# Patient Record
Sex: Male | Born: 1945 | Race: White | Hispanic: No | Marital: Married | Smoking: Former smoker
Health system: Southern US, Community
[De-identification: ages and names within clinical notes are randomized; demographics above are authoritative.]

## PROBLEM LIST (undated history)

## (undated) DIAGNOSIS — E790 Hyperuricemia without signs of inflammatory arthritis and tophaceous disease: Secondary | ICD-10-CM

## (undated) DIAGNOSIS — E785 Hyperlipidemia, unspecified: Secondary | ICD-10-CM

## (undated) DIAGNOSIS — N289 Disorder of kidney and ureter, unspecified: Secondary | ICD-10-CM

## (undated) DIAGNOSIS — N529 Male erectile dysfunction, unspecified: Secondary | ICD-10-CM

## (undated) DIAGNOSIS — M545 Low back pain, unspecified: Secondary | ICD-10-CM

## (undated) DIAGNOSIS — K222 Esophageal obstruction: Secondary | ICD-10-CM

## (undated) DIAGNOSIS — F419 Anxiety disorder, unspecified: Secondary | ICD-10-CM

## (undated) DIAGNOSIS — Z86010 Personal history of colonic polyps: Secondary | ICD-10-CM

## (undated) DIAGNOSIS — G4733 Obstructive sleep apnea (adult) (pediatric): Secondary | ICD-10-CM

## (undated) DIAGNOSIS — M1990 Unspecified osteoarthritis, unspecified site: Secondary | ICD-10-CM

## (undated) DIAGNOSIS — I2510 Atherosclerotic heart disease of native coronary artery without angina pectoris: Secondary | ICD-10-CM

## (undated) DIAGNOSIS — K2950 Unspecified chronic gastritis without bleeding: Secondary | ICD-10-CM

## (undated) DIAGNOSIS — I10 Essential (primary) hypertension: Secondary | ICD-10-CM

## (undated) DIAGNOSIS — K5730 Diverticulosis of large intestine without perforation or abscess without bleeding: Secondary | ICD-10-CM

## (undated) DIAGNOSIS — G589 Mononeuropathy, unspecified: Secondary | ICD-10-CM

## (undated) DIAGNOSIS — B009 Herpesviral infection, unspecified: Secondary | ICD-10-CM

## (undated) DIAGNOSIS — K219 Gastro-esophageal reflux disease without esophagitis: Secondary | ICD-10-CM

## (undated) DIAGNOSIS — E669 Obesity, unspecified: Secondary | ICD-10-CM

## (undated) HISTORY — DX: Male erectile dysfunction, unspecified: N52.9

## (undated) HISTORY — PX: ANGIOPLASTY: SHX39

## (undated) HISTORY — DX: Hyperuricemia without signs of inflammatory arthritis and tophaceous disease: E79.0

## (undated) HISTORY — DX: Gastro-esophageal reflux disease without esophagitis: K21.9

## (undated) HISTORY — DX: Low back pain, unspecified: M54.50

## (undated) HISTORY — DX: Personal history of colonic polyps: Z86.010

## (undated) HISTORY — PX: SKIN BIOPSY: SHX1

## (undated) HISTORY — DX: Diverticulosis of large intestine without perforation or abscess without bleeding: K57.30

## (undated) HISTORY — PX: CORONARY ARTERY BYPASS GRAFT: SHX141

## (undated) HISTORY — DX: Obesity, unspecified: E66.9

## (undated) HISTORY — DX: Herpesviral infection, unspecified: B00.9

## (undated) HISTORY — DX: Disorder of kidney and ureter, unspecified: N28.9

## (undated) HISTORY — DX: Anxiety disorder, unspecified: F41.9

## (undated) HISTORY — DX: Atherosclerotic heart disease of native coronary artery without angina pectoris: I25.10

## (undated) HISTORY — DX: Unspecified chronic gastritis without bleeding: K29.50

## (undated) HISTORY — DX: Mononeuropathy, unspecified: G58.9

## (undated) HISTORY — DX: Low back pain: M54.5

## (undated) HISTORY — DX: Essential (primary) hypertension: I10

## (undated) HISTORY — DX: Unspecified osteoarthritis, unspecified site: M19.90

## (undated) HISTORY — DX: Obstructive sleep apnea (adult) (pediatric): G47.33

## (undated) HISTORY — DX: Hyperlipidemia, unspecified: E78.5

## (undated) HISTORY — DX: Esophageal obstruction: K22.2

---

## 1999-09-25 ENCOUNTER — Ambulatory Visit: Admission: RE | Admit: 1999-09-25 | Discharge: 1999-09-25 | Payer: Self-pay | Admitting: Pulmonary Disease

## 1999-09-25 ENCOUNTER — Encounter: Payer: Self-pay | Admitting: Internal Medicine

## 2000-01-30 ENCOUNTER — Other Ambulatory Visit: Admission: RE | Admit: 2000-01-30 | Discharge: 2000-01-30 | Payer: Self-pay | Admitting: Urology

## 2000-02-05 ENCOUNTER — Encounter: Payer: Self-pay | Admitting: Internal Medicine

## 2000-02-05 ENCOUNTER — Other Ambulatory Visit: Admission: RE | Admit: 2000-02-05 | Discharge: 2000-02-05 | Payer: Self-pay | Admitting: Urology

## 2000-02-05 ENCOUNTER — Ambulatory Visit (HOSPITAL_BASED_OUTPATIENT_CLINIC_OR_DEPARTMENT_OTHER): Admission: RE | Admit: 2000-02-05 | Discharge: 2000-02-05 | Payer: Self-pay | Admitting: Pulmonary Disease

## 2000-02-05 ENCOUNTER — Encounter (INDEPENDENT_AMBULATORY_CARE_PROVIDER_SITE_OTHER): Payer: Self-pay | Admitting: Specialist

## 2001-03-04 ENCOUNTER — Encounter: Payer: Self-pay | Admitting: *Deleted

## 2001-03-05 ENCOUNTER — Inpatient Hospital Stay (HOSPITAL_COMMUNITY): Admission: RE | Admit: 2001-03-05 | Discharge: 2001-03-09 | Payer: Self-pay | Admitting: *Deleted

## 2001-03-05 ENCOUNTER — Encounter: Payer: Self-pay | Admitting: Thoracic Surgery (Cardiothoracic Vascular Surgery)

## 2001-03-06 ENCOUNTER — Encounter: Payer: Self-pay | Admitting: Thoracic Surgery (Cardiothoracic Vascular Surgery)

## 2001-03-07 ENCOUNTER — Encounter: Payer: Self-pay | Admitting: Thoracic Surgery (Cardiothoracic Vascular Surgery)

## 2001-04-08 ENCOUNTER — Encounter (HOSPITAL_COMMUNITY): Admission: RE | Admit: 2001-04-08 | Discharge: 2001-04-25 | Payer: Self-pay | Admitting: *Deleted

## 2001-11-18 ENCOUNTER — Inpatient Hospital Stay (HOSPITAL_COMMUNITY): Admission: EM | Admit: 2001-11-18 | Discharge: 2001-11-21 | Payer: Self-pay | Admitting: *Deleted

## 2001-11-19 ENCOUNTER — Encounter: Payer: Self-pay | Admitting: Cardiovascular Disease

## 2002-10-02 ENCOUNTER — Emergency Department (HOSPITAL_COMMUNITY): Admission: EM | Admit: 2002-10-02 | Discharge: 2002-10-02 | Payer: Self-pay | Admitting: Emergency Medicine

## 2003-07-11 ENCOUNTER — Emergency Department (HOSPITAL_COMMUNITY): Admission: AD | Admit: 2003-07-11 | Discharge: 2003-07-11 | Payer: Self-pay | Admitting: Emergency Medicine

## 2003-08-27 ENCOUNTER — Emergency Department (HOSPITAL_COMMUNITY): Admission: EM | Admit: 2003-08-27 | Discharge: 2003-08-28 | Payer: Self-pay | Admitting: Emergency Medicine

## 2004-01-04 ENCOUNTER — Encounter (INDEPENDENT_AMBULATORY_CARE_PROVIDER_SITE_OTHER): Payer: Self-pay | Admitting: *Deleted

## 2004-06-18 HISTORY — PX: CHOLECYSTECTOMY: SHX55

## 2004-06-21 ENCOUNTER — Ambulatory Visit: Payer: Self-pay | Admitting: Pulmonary Disease

## 2004-07-20 ENCOUNTER — Ambulatory Visit: Payer: Self-pay | Admitting: Cardiology

## 2004-07-20 ENCOUNTER — Inpatient Hospital Stay (HOSPITAL_COMMUNITY): Admission: EM | Admit: 2004-07-20 | Discharge: 2004-07-23 | Payer: Self-pay | Admitting: Emergency Medicine

## 2004-07-21 ENCOUNTER — Encounter: Payer: Self-pay | Admitting: Cardiology

## 2004-08-08 ENCOUNTER — Ambulatory Visit: Payer: Self-pay | Admitting: Cardiology

## 2004-08-10 ENCOUNTER — Ambulatory Visit (HOSPITAL_COMMUNITY): Admission: RE | Admit: 2004-08-10 | Discharge: 2004-08-10 | Payer: Self-pay | Admitting: Cardiology

## 2004-08-14 ENCOUNTER — Ambulatory Visit (HOSPITAL_COMMUNITY): Admission: RE | Admit: 2004-08-14 | Discharge: 2004-08-14 | Payer: Self-pay | Admitting: Cardiology

## 2004-08-14 ENCOUNTER — Encounter (INDEPENDENT_AMBULATORY_CARE_PROVIDER_SITE_OTHER): Payer: Self-pay | Admitting: *Deleted

## 2004-09-06 ENCOUNTER — Encounter (INDEPENDENT_AMBULATORY_CARE_PROVIDER_SITE_OTHER): Payer: Self-pay | Admitting: *Deleted

## 2004-09-06 ENCOUNTER — Ambulatory Visit: Payer: Self-pay | Admitting: Gastroenterology

## 2004-09-07 ENCOUNTER — Ambulatory Visit: Payer: Self-pay | Admitting: Gastroenterology

## 2004-09-10 ENCOUNTER — Encounter (INDEPENDENT_AMBULATORY_CARE_PROVIDER_SITE_OTHER): Payer: Self-pay | Admitting: *Deleted

## 2004-09-19 ENCOUNTER — Ambulatory Visit: Payer: Self-pay | Admitting: Cardiology

## 2004-09-19 ENCOUNTER — Ambulatory Visit: Payer: Self-pay | Admitting: Pulmonary Disease

## 2004-10-16 ENCOUNTER — Encounter (INDEPENDENT_AMBULATORY_CARE_PROVIDER_SITE_OTHER): Payer: Self-pay | Admitting: *Deleted

## 2004-10-16 ENCOUNTER — Ambulatory Visit: Payer: Self-pay | Admitting: Gastroenterology

## 2004-11-16 ENCOUNTER — Ambulatory Visit: Payer: Self-pay | Admitting: Internal Medicine

## 2004-12-12 ENCOUNTER — Ambulatory Visit: Payer: Self-pay

## 2004-12-15 ENCOUNTER — Ambulatory Visit: Payer: Self-pay | Admitting: Cardiology

## 2005-01-05 ENCOUNTER — Ambulatory Visit: Payer: Self-pay | Admitting: Pulmonary Disease

## 2005-01-17 ENCOUNTER — Ambulatory Visit: Payer: Self-pay | Admitting: Pulmonary Disease

## 2005-02-12 ENCOUNTER — Ambulatory Visit: Payer: Self-pay | Admitting: Pulmonary Disease

## 2005-02-20 ENCOUNTER — Ambulatory Visit: Payer: Self-pay | Admitting: Pulmonary Disease

## 2005-03-22 ENCOUNTER — Ambulatory Visit: Payer: Self-pay | Admitting: Pulmonary Disease

## 2005-04-25 ENCOUNTER — Ambulatory Visit: Payer: Self-pay | Admitting: Pulmonary Disease

## 2005-06-04 ENCOUNTER — Ambulatory Visit: Payer: Self-pay | Admitting: Cardiology

## 2005-06-22 ENCOUNTER — Ambulatory Visit: Payer: Self-pay | Admitting: Pulmonary Disease

## 2005-11-16 ENCOUNTER — Ambulatory Visit: Payer: Self-pay | Admitting: Internal Medicine

## 2005-11-19 ENCOUNTER — Ambulatory Visit: Payer: Self-pay | Admitting: Pulmonary Disease

## 2005-11-27 ENCOUNTER — Ambulatory Visit: Payer: Self-pay | Admitting: Pulmonary Disease

## 2006-02-18 ENCOUNTER — Inpatient Hospital Stay (HOSPITAL_COMMUNITY): Admission: EM | Admit: 2006-02-18 | Discharge: 2006-02-20 | Payer: Self-pay | Admitting: Emergency Medicine

## 2006-02-18 ENCOUNTER — Ambulatory Visit: Payer: Self-pay | Admitting: *Deleted

## 2006-02-22 ENCOUNTER — Ambulatory Visit: Payer: Self-pay

## 2006-02-26 ENCOUNTER — Ambulatory Visit: Payer: Self-pay | Admitting: Gastroenterology

## 2006-03-21 ENCOUNTER — Ambulatory Visit (HOSPITAL_COMMUNITY): Admission: RE | Admit: 2006-03-21 | Discharge: 2006-03-21 | Payer: Self-pay | Admitting: General Surgery

## 2006-03-21 ENCOUNTER — Encounter (INDEPENDENT_AMBULATORY_CARE_PROVIDER_SITE_OTHER): Payer: Self-pay | Admitting: *Deleted

## 2006-03-21 ENCOUNTER — Encounter: Payer: Self-pay | Admitting: Internal Medicine

## 2006-04-03 ENCOUNTER — Ambulatory Visit: Payer: Self-pay | Admitting: Internal Medicine

## 2006-05-08 ENCOUNTER — Ambulatory Visit: Payer: Self-pay | Admitting: Pulmonary Disease

## 2006-06-19 ENCOUNTER — Ambulatory Visit: Payer: Self-pay | Admitting: Pulmonary Disease

## 2006-07-01 ENCOUNTER — Emergency Department (HOSPITAL_COMMUNITY): Admission: EM | Admit: 2006-07-01 | Discharge: 2006-07-02 | Payer: Self-pay | Admitting: Emergency Medicine

## 2006-07-01 ENCOUNTER — Ambulatory Visit: Payer: Self-pay | Admitting: Cardiology

## 2006-07-24 ENCOUNTER — Ambulatory Visit: Payer: Self-pay | Admitting: Internal Medicine

## 2007-01-13 ENCOUNTER — Ambulatory Visit: Payer: Self-pay | Admitting: Pulmonary Disease

## 2007-01-13 LAB — CONVERTED CEMR LAB
ALT: 28 units/L (ref 0–53)
AST: 22 units/L (ref 0–37)
Albumin: 4.1 g/dL (ref 3.5–5.2)
Basophils Absolute: 0 10*3/uL (ref 0.0–0.1)
Calcium: 9.3 mg/dL (ref 8.4–10.5)
Chloride: 103 meq/L (ref 96–112)
Creatinine, Ser: 1.4 mg/dL (ref 0.4–1.5)
Eosinophils Absolute: 0.4 10*3/uL (ref 0.0–0.6)
Eosinophils Relative: 4.5 % (ref 0.0–5.0)
GFR calc non Af Amer: 55 mL/min
Glucose, Bld: 97 mg/dL (ref 70–99)
HCT: 46.2 % (ref 39.0–52.0)
Hgb A1c MFr Bld: 5.9 % (ref 4.6–6.0)
LDL Cholesterol: 101 mg/dL — ABNORMAL HIGH (ref 0–99)
Neutrophils Relative %: 67.5 % (ref 43.0–77.0)
Platelets: 277 10*3/uL (ref 150–400)
RBC: 5.3 M/uL (ref 4.22–5.81)
RDW: 12.8 % (ref 11.5–14.6)
Sodium: 142 meq/L (ref 135–145)
Total Bilirubin: 1.2 mg/dL (ref 0.3–1.2)
Total CHOL/HDL Ratio: 5.2
Triglycerides: 127 mg/dL (ref 0–149)
WBC: 9.1 10*3/uL (ref 4.5–10.5)

## 2007-01-22 ENCOUNTER — Ambulatory Visit: Payer: Self-pay | Admitting: Internal Medicine

## 2007-01-22 ENCOUNTER — Ambulatory Visit: Payer: Self-pay

## 2007-04-02 ENCOUNTER — Ambulatory Visit: Payer: Self-pay | Admitting: Pulmonary Disease

## 2007-06-03 ENCOUNTER — Emergency Department (HOSPITAL_COMMUNITY): Admission: EM | Admit: 2007-06-03 | Discharge: 2007-06-03 | Payer: Self-pay | Admitting: Emergency Medicine

## 2007-06-09 DIAGNOSIS — G4733 Obstructive sleep apnea (adult) (pediatric): Secondary | ICD-10-CM | POA: Insufficient documentation

## 2007-06-09 DIAGNOSIS — E785 Hyperlipidemia, unspecified: Secondary | ICD-10-CM

## 2007-06-09 DIAGNOSIS — I10 Essential (primary) hypertension: Secondary | ICD-10-CM | POA: Insufficient documentation

## 2007-06-23 ENCOUNTER — Telehealth (INDEPENDENT_AMBULATORY_CARE_PROVIDER_SITE_OTHER): Payer: Self-pay | Admitting: *Deleted

## 2007-07-10 ENCOUNTER — Ambulatory Visit: Payer: Self-pay | Admitting: Pulmonary Disease

## 2007-07-10 DIAGNOSIS — F411 Generalized anxiety disorder: Secondary | ICD-10-CM | POA: Insufficient documentation

## 2007-07-10 DIAGNOSIS — I2510 Atherosclerotic heart disease of native coronary artery without angina pectoris: Secondary | ICD-10-CM | POA: Insufficient documentation

## 2007-07-10 DIAGNOSIS — N529 Male erectile dysfunction, unspecified: Secondary | ICD-10-CM

## 2007-07-10 DIAGNOSIS — M545 Low back pain: Secondary | ICD-10-CM

## 2007-07-10 DIAGNOSIS — R7989 Other specified abnormal findings of blood chemistry: Secondary | ICD-10-CM | POA: Insufficient documentation

## 2007-07-10 DIAGNOSIS — K5730 Diverticulosis of large intestine without perforation or abscess without bleeding: Secondary | ICD-10-CM | POA: Insufficient documentation

## 2007-07-10 DIAGNOSIS — K219 Gastro-esophageal reflux disease without esophagitis: Secondary | ICD-10-CM

## 2007-07-10 DIAGNOSIS — M1990 Unspecified osteoarthritis, unspecified site: Secondary | ICD-10-CM | POA: Insufficient documentation

## 2007-07-10 DIAGNOSIS — E669 Obesity, unspecified: Secondary | ICD-10-CM

## 2007-07-10 DIAGNOSIS — D126 Benign neoplasm of colon, unspecified: Secondary | ICD-10-CM

## 2007-07-10 LAB — CONVERTED CEMR LAB
ALT: 32 units/L (ref 0–53)
AST: 24 units/L (ref 0–37)
Albumin: 4.2 g/dL (ref 3.5–5.2)
Alkaline Phosphatase: 96 units/L (ref 39–117)
BUN: 22 mg/dL (ref 6–23)
Basophils Absolute: 0 10*3/uL (ref 0.0–0.1)
Basophils Relative: 0.2 % (ref 0.0–1.0)
Bilirubin, Direct: 0.1 mg/dL (ref 0.0–0.3)
CO2: 30 meq/L (ref 19–32)
Calcium: 9.7 mg/dL (ref 8.4–10.5)
Chloride: 99 meq/L (ref 96–112)
Cholesterol: 125 mg/dL (ref 0–200)
Creatinine, Ser: 1.4 mg/dL (ref 0.4–1.5)
Eosinophils Absolute: 0.3 10*3/uL (ref 0.0–0.6)
Eosinophils Relative: 3.3 % (ref 0.0–5.0)
GFR calc Af Amer: 66 mL/min
GFR calc non Af Amer: 55 mL/min
Glucose, Bld: 102 mg/dL — ABNORMAL HIGH (ref 70–99)
HCT: 45.9 % (ref 39.0–52.0)
HDL: 30.8 mg/dL — ABNORMAL LOW (ref >39.0)
Hemoglobin: 15.8 g/dL (ref 13.0–17.0)
Hgb A1c MFr Bld: 6 % (ref 4.6–6.0)
LDL Cholesterol: 73 mg/dL (ref 0–99)
Lymphocytes Relative: 17.8 % (ref 12.0–46.0)
MCHC: 34.3 g/dL (ref 30.0–36.0)
MCV: 87.9 fL (ref 78.0–100.0)
Monocytes Absolute: 0.8 10*3/uL — ABNORMAL HIGH (ref 0.2–0.7)
Monocytes Relative: 8.7 % (ref 3.0–11.0)
Neutro Abs: 6.4 10*3/uL (ref 1.4–7.7)
Neutrophils Relative %: 70 % (ref 43.0–77.0)
PSA: 1.08 ng/mL (ref 0.10–4.00)
Platelets: 260 10*3/uL (ref 150–400)
Potassium: 4.3 meq/L (ref 3.5–5.1)
RBC: 5.22 M/uL (ref 4.22–5.81)
RDW: 12.9 % (ref 11.5–14.6)
Sodium: 137 meq/L (ref 135–145)
TSH: 1.27 microintl units/mL (ref 0.35–5.50)
Total Bilirubin: 0.8 mg/dL (ref 0.3–1.2)
Total CHOL/HDL Ratio: 4.1
Total Protein: 7.6 g/dL (ref 6.0–8.3)
Triglycerides: 105 mg/dL (ref 0–149)
VLDL: 21 mg/dL (ref 0–40)
WBC: 9.1 10*3/uL (ref 4.5–10.5)

## 2007-07-25 ENCOUNTER — Ambulatory Visit: Payer: Self-pay | Admitting: Internal Medicine

## 2007-08-26 ENCOUNTER — Ambulatory Visit: Payer: Self-pay | Admitting: Pulmonary Disease

## 2007-08-28 ENCOUNTER — Encounter: Payer: Self-pay | Admitting: Pulmonary Disease

## 2007-09-15 ENCOUNTER — Encounter: Payer: Self-pay | Admitting: Pulmonary Disease

## 2007-09-18 ENCOUNTER — Ambulatory Visit: Payer: Self-pay | Admitting: Internal Medicine

## 2007-09-18 ENCOUNTER — Emergency Department (HOSPITAL_COMMUNITY): Admission: EM | Admit: 2007-09-18 | Discharge: 2007-09-18 | Payer: Self-pay | Admitting: Emergency Medicine

## 2007-09-24 ENCOUNTER — Telehealth: Payer: Self-pay | Admitting: Pulmonary Disease

## 2007-10-01 ENCOUNTER — Ambulatory Visit: Payer: Self-pay

## 2007-10-01 ENCOUNTER — Encounter: Payer: Self-pay | Admitting: Pulmonary Disease

## 2007-10-15 ENCOUNTER — Telehealth: Payer: Self-pay | Admitting: Pulmonary Disease

## 2007-10-17 ENCOUNTER — Telehealth (INDEPENDENT_AMBULATORY_CARE_PROVIDER_SITE_OTHER): Payer: Self-pay | Admitting: *Deleted

## 2007-10-21 ENCOUNTER — Ambulatory Visit: Payer: Self-pay | Admitting: Internal Medicine

## 2007-10-27 ENCOUNTER — Telehealth (INDEPENDENT_AMBULATORY_CARE_PROVIDER_SITE_OTHER): Payer: Self-pay | Admitting: *Deleted

## 2007-11-06 ENCOUNTER — Encounter: Payer: Self-pay | Admitting: Pulmonary Disease

## 2007-11-17 ENCOUNTER — Telehealth (INDEPENDENT_AMBULATORY_CARE_PROVIDER_SITE_OTHER): Payer: Self-pay | Admitting: *Deleted

## 2007-11-18 ENCOUNTER — Ambulatory Visit: Payer: Self-pay | Admitting: Pulmonary Disease

## 2007-11-18 ENCOUNTER — Telehealth: Payer: Self-pay | Admitting: Adult Health

## 2007-11-24 ENCOUNTER — Telehealth (INDEPENDENT_AMBULATORY_CARE_PROVIDER_SITE_OTHER): Payer: Self-pay | Admitting: *Deleted

## 2007-11-28 ENCOUNTER — Encounter: Payer: Self-pay | Admitting: Pulmonary Disease

## 2008-01-07 ENCOUNTER — Ambulatory Visit: Payer: Self-pay | Admitting: Pulmonary Disease

## 2008-01-11 LAB — CONVERTED CEMR LAB
Basophils Absolute: 0 10*3/uL (ref 0.0–0.1)
Bilirubin, Direct: 0.1 mg/dL (ref 0.0–0.3)
Calcium: 9.6 mg/dL (ref 8.4–10.5)
Cholesterol: 142 mg/dL (ref 0–200)
GFR calc Af Amer: 79 mL/min
HCT: 41.2 % (ref 39.0–52.0)
HDL: 31.2 mg/dL — ABNORMAL LOW (ref >39.0)
Hemoglobin: 14.2 g/dL (ref 13.0–17.0)
LDL Cholesterol: 89 mg/dL (ref 0–99)
Lymphocytes Relative: 16.5 % (ref 12.0–46.0)
MCHC: 34.4 g/dL (ref 30.0–36.0)
Monocytes Absolute: 0.7 10*3/uL (ref 0.1–1.0)
Neutro Abs: 6.6 10*3/uL (ref 1.4–7.7)
Platelets: 257 10*3/uL (ref 150–400)
RDW: 14.7 % — ABNORMAL HIGH (ref 11.5–14.6)
Sodium: 141 meq/L (ref 135–145)
Total Bilirubin: 0.8 mg/dL (ref 0.3–1.2)
Triglycerides: 111 mg/dL (ref 0–149)

## 2008-01-22 ENCOUNTER — Telehealth (INDEPENDENT_AMBULATORY_CARE_PROVIDER_SITE_OTHER): Payer: Self-pay | Admitting: *Deleted

## 2008-02-06 ENCOUNTER — Ambulatory Visit: Payer: Self-pay | Admitting: Internal Medicine

## 2008-02-06 ENCOUNTER — Ambulatory Visit: Payer: Self-pay

## 2008-03-24 ENCOUNTER — Ambulatory Visit: Payer: Self-pay | Admitting: Pulmonary Disease

## 2008-05-19 ENCOUNTER — Telehealth (INDEPENDENT_AMBULATORY_CARE_PROVIDER_SITE_OTHER): Payer: Self-pay | Admitting: *Deleted

## 2008-06-28 ENCOUNTER — Telehealth (INDEPENDENT_AMBULATORY_CARE_PROVIDER_SITE_OTHER): Payer: Self-pay | Admitting: *Deleted

## 2008-07-05 ENCOUNTER — Ambulatory Visit: Payer: Self-pay | Admitting: Pulmonary Disease

## 2008-07-06 LAB — CONVERTED CEMR LAB
Basophils Absolute: 0.1 10*3/uL (ref 0.0–0.1)
Bilirubin Urine: NEGATIVE
Calcium: 9.5 mg/dL (ref 8.4–10.5)
Chloride: 105 meq/L (ref 96–112)
Cholesterol: 130 mg/dL (ref 0–200)
Creatinine, Ser: 1.8 mg/dL — ABNORMAL HIGH (ref 0.4–1.5)
GFR calc Af Amer: 49 mL/min
GFR calc non Af Amer: 41 mL/min
HDL: 30.2 mg/dL — ABNORMAL LOW (ref >39.0)
Hgb A1c MFr Bld: 6.1 % — ABNORMAL HIGH (ref 4.6–6.0)
LDL Cholesterol: 80 mg/dL (ref 0–99)
Leukocytes, UA: NEGATIVE
Lymphocytes Relative: 14.7 % (ref 12.0–46.0)
MCHC: 33.7 g/dL (ref 30.0–36.0)
Mucus, UA: NEGATIVE
Neutro Abs: 6.4 10*3/uL (ref 1.4–7.7)
Neutrophils Relative %: 70.1 % (ref 43.0–77.0)
Nitrite: NEGATIVE
PSA: 1.83 ng/mL (ref 0.10–4.00)
RDW: 14.7 % — ABNORMAL HIGH (ref 11.5–14.6)
TSH: 2.61 microintl units/mL (ref 0.35–5.50)
Total Bilirubin: 0.8 mg/dL (ref 0.3–1.2)
Triglycerides: 99 mg/dL (ref 0–149)
VLDL: 20 mg/dL (ref 0–40)
pH: 6 (ref 5.0–8.0)

## 2008-08-19 ENCOUNTER — Ambulatory Visit: Payer: Self-pay | Admitting: Internal Medicine

## 2008-08-19 ENCOUNTER — Encounter: Payer: Self-pay | Admitting: Internal Medicine

## 2008-09-22 ENCOUNTER — Telehealth: Payer: Self-pay | Admitting: Pulmonary Disease

## 2009-01-10 ENCOUNTER — Telehealth: Payer: Self-pay | Admitting: Pulmonary Disease

## 2009-03-02 ENCOUNTER — Ambulatory Visit: Payer: Self-pay | Admitting: Pulmonary Disease

## 2009-03-02 DIAGNOSIS — M109 Gout, unspecified: Secondary | ICD-10-CM

## 2009-03-03 LAB — CONVERTED CEMR LAB: Uric Acid, Serum: 5.9 mg/dL (ref 4.0–7.8)

## 2009-03-11 ENCOUNTER — Ambulatory Visit: Payer: Self-pay | Admitting: Pulmonary Disease

## 2009-03-23 ENCOUNTER — Telehealth (INDEPENDENT_AMBULATORY_CARE_PROVIDER_SITE_OTHER): Payer: Self-pay | Admitting: *Deleted

## 2009-03-28 ENCOUNTER — Ambulatory Visit: Payer: Self-pay | Admitting: Pulmonary Disease

## 2009-03-28 DIAGNOSIS — N259 Disorder resulting from impaired renal tubular function, unspecified: Secondary | ICD-10-CM | POA: Insufficient documentation

## 2009-03-28 DIAGNOSIS — J309 Allergic rhinitis, unspecified: Secondary | ICD-10-CM | POA: Insufficient documentation

## 2009-03-30 ENCOUNTER — Encounter: Payer: Self-pay | Admitting: Internal Medicine

## 2009-05-27 ENCOUNTER — Telehealth (INDEPENDENT_AMBULATORY_CARE_PROVIDER_SITE_OTHER): Payer: Self-pay | Admitting: *Deleted

## 2009-07-27 ENCOUNTER — Telehealth (INDEPENDENT_AMBULATORY_CARE_PROVIDER_SITE_OTHER): Payer: Self-pay | Admitting: *Deleted

## 2009-08-03 ENCOUNTER — Telehealth (INDEPENDENT_AMBULATORY_CARE_PROVIDER_SITE_OTHER): Payer: Self-pay | Admitting: *Deleted

## 2009-08-05 ENCOUNTER — Encounter: Payer: Self-pay | Admitting: Pulmonary Disease

## 2009-08-05 ENCOUNTER — Telehealth (INDEPENDENT_AMBULATORY_CARE_PROVIDER_SITE_OTHER): Payer: Self-pay | Admitting: *Deleted

## 2009-08-17 ENCOUNTER — Ambulatory Visit: Payer: Self-pay | Admitting: Pulmonary Disease

## 2009-08-17 ENCOUNTER — Encounter (INDEPENDENT_AMBULATORY_CARE_PROVIDER_SITE_OTHER): Payer: Self-pay | Admitting: *Deleted

## 2009-08-17 LAB — CONVERTED CEMR LAB
ALT: 27 units/L (ref 0–53)
AST: 24 units/L (ref 0–37)
Albumin: 4.2 g/dL (ref 3.5–5.2)
Alkaline Phosphatase: 88 units/L (ref 39–117)
BUN: 17 mg/dL (ref 6–23)
Basophils Absolute: 0.1 10*3/uL (ref 0.0–0.1)
Bilirubin Urine: NEGATIVE
Chloride: 102 meq/L (ref 96–112)
Cholesterol: 138 mg/dL (ref 0–200)
Eosinophils Relative: 4.1 % (ref 0.0–5.0)
Glucose, Bld: 98 mg/dL (ref 70–99)
Hemoglobin, Urine: NEGATIVE
Ketones, ur: NEGATIVE mg/dL
MCV: 89.9 fL (ref 78.0–100.0)
Monocytes Absolute: 0.7 10*3/uL (ref 0.1–1.0)
Neutrophils Relative %: 72.2 % (ref 43.0–77.0)
Platelets: 205 10*3/uL (ref 150.0–400.0)
Potassium: 4.5 meq/L (ref 3.5–5.1)
RDW: 14.6 % (ref 11.5–14.6)
TSH: 2.74 microintl units/mL (ref 0.35–5.50)
Total Protein, Urine: NEGATIVE mg/dL
Uric Acid, Serum: 6.3 mg/dL (ref 4.0–7.8)
Urine Glucose: NEGATIVE mg/dL
Urobilinogen, UA: 0.2 (ref 0.0–1.0)
WBC: 8.9 10*3/uL (ref 4.5–10.5)

## 2009-08-25 ENCOUNTER — Ambulatory Visit: Payer: Self-pay | Admitting: Internal Medicine

## 2009-08-25 DIAGNOSIS — R072 Precordial pain: Secondary | ICD-10-CM

## 2009-09-06 ENCOUNTER — Encounter: Payer: Self-pay | Admitting: Pulmonary Disease

## 2009-09-13 ENCOUNTER — Encounter: Payer: Self-pay | Admitting: Pulmonary Disease

## 2009-09-29 ENCOUNTER — Ambulatory Visit: Payer: Self-pay

## 2009-09-29 ENCOUNTER — Ambulatory Visit: Payer: Self-pay | Admitting: Internal Medicine

## 2009-11-03 ENCOUNTER — Telehealth (INDEPENDENT_AMBULATORY_CARE_PROVIDER_SITE_OTHER): Payer: Self-pay | Admitting: *Deleted

## 2009-11-10 ENCOUNTER — Telehealth (INDEPENDENT_AMBULATORY_CARE_PROVIDER_SITE_OTHER): Payer: Self-pay | Admitting: *Deleted

## 2009-12-06 ENCOUNTER — Telehealth (INDEPENDENT_AMBULATORY_CARE_PROVIDER_SITE_OTHER): Payer: Self-pay | Admitting: *Deleted

## 2009-12-09 ENCOUNTER — Telehealth: Payer: Self-pay | Admitting: Pulmonary Disease

## 2009-12-12 ENCOUNTER — Ambulatory Visit: Payer: Self-pay | Admitting: Pulmonary Disease

## 2009-12-29 ENCOUNTER — Ambulatory Visit: Payer: Self-pay | Admitting: Internal Medicine

## 2010-01-05 ENCOUNTER — Telehealth: Payer: Self-pay | Admitting: Pulmonary Disease

## 2010-01-05 ENCOUNTER — Telehealth: Payer: Self-pay | Admitting: Internal Medicine

## 2010-02-14 ENCOUNTER — Ambulatory Visit: Payer: Self-pay | Admitting: Pulmonary Disease

## 2010-02-16 LAB — CONVERTED CEMR LAB
BUN: 20 mg/dL (ref 6–23)
CO2: 29 meq/L (ref 19–32)
Chloride: 104 meq/L (ref 96–112)
Glucose, Bld: 91 mg/dL (ref 70–99)
HDL: 33.1 mg/dL — ABNORMAL LOW (ref >39.00)
LDL Cholesterol: 78 mg/dL (ref 0–99)
Potassium: 4.8 meq/L (ref 3.5–5.1)
Sodium: 142 meq/L (ref 135–145)
Total CHOL/HDL Ratio: 4
VLDL: 29 mg/dL (ref 0.0–40.0)

## 2010-02-22 ENCOUNTER — Telehealth (INDEPENDENT_AMBULATORY_CARE_PROVIDER_SITE_OTHER): Payer: Self-pay | Admitting: *Deleted

## 2010-03-09 ENCOUNTER — Encounter: Payer: Self-pay | Admitting: Pulmonary Disease

## 2010-05-08 ENCOUNTER — Telehealth: Payer: Self-pay | Admitting: Pulmonary Disease

## 2010-05-22 ENCOUNTER — Telehealth: Payer: Self-pay | Admitting: Internal Medicine

## 2010-05-25 ENCOUNTER — Encounter: Payer: Self-pay | Admitting: Internal Medicine

## 2010-05-25 ENCOUNTER — Telehealth: Payer: Self-pay | Admitting: Pulmonary Disease

## 2010-05-31 ENCOUNTER — Telehealth (INDEPENDENT_AMBULATORY_CARE_PROVIDER_SITE_OTHER): Payer: Self-pay | Admitting: *Deleted

## 2010-06-21 ENCOUNTER — Ambulatory Visit
Admission: RE | Admit: 2010-06-21 | Discharge: 2010-06-21 | Payer: Self-pay | Source: Home / Self Care | Attending: Internal Medicine | Admitting: Internal Medicine

## 2010-06-21 DIAGNOSIS — K625 Hemorrhage of anus and rectum: Secondary | ICD-10-CM | POA: Insufficient documentation

## 2010-06-21 DIAGNOSIS — R1310 Dysphagia, unspecified: Secondary | ICD-10-CM | POA: Insufficient documentation

## 2010-06-21 DIAGNOSIS — R079 Chest pain, unspecified: Secondary | ICD-10-CM | POA: Insufficient documentation

## 2010-06-29 ENCOUNTER — Ambulatory Visit
Admission: RE | Admit: 2010-06-29 | Discharge: 2010-06-29 | Payer: Self-pay | Source: Home / Self Care | Attending: Internal Medicine | Admitting: Internal Medicine

## 2010-06-29 ENCOUNTER — Encounter: Payer: Self-pay | Admitting: Internal Medicine

## 2010-06-30 ENCOUNTER — Telehealth: Payer: Self-pay | Admitting: Internal Medicine

## 2010-07-09 ENCOUNTER — Encounter: Payer: Self-pay | Admitting: Cardiology

## 2010-07-18 NOTE — Medication Information (Signed)
Summary: CPAP Mask/[facility]  CPAP Mask/[facility]   Imported By: Sherian Rein 09/09/2009 11:52:06  _____________________________________________________________________  External Attachment:    Type:   Image     Comment:   External Document

## 2010-07-18 NOTE — Assessment & Plan Note (Signed)
Summary: 6 months/apc   Primary Care Provider:  Segundo Makela, MD  CC:  6 month ROV & review of mult medical problems....  History of Present Illness: 65 y/o WM here for a 6 month follow up visit... he has multiple medical problems as noted below...     ~  March 28, 2009:  he dropped a timber on the top of his left foot- XRay neg for Fx, he had a hematoma and mild cellulitis- Rx'd Keflex, rest elevation, etc... now improved overall...  he saw DrBensimhon for a yearly check 3/10- doing satis w/ his CAD, HBP, Chol (LDL goal <70) & advised to lose weight...  he notes some nasal congestion Qhs & he's been exac the problem w/ Afrin- advised Antihist, Saline, Flonase...   ~  August 17, 2009:  his CC is arthritis in his left knee- prev eval by Rolla Plate w/ shot... he has Vicodin for Prn use & a brace for exercise program... he will f/u w/ ortho when needed...  BP controlled on meds;  no episodes of CP, etc;  Chol OK on Lovastatin, but unfortunately he hasn't gotten his weight down...   ~  February 14, 2010:  c/o some left sholder pain> treatedw/ Motrin, Skelaxin, Ice, etc> improved...  he had one epis of CP after eating pizza & it resolved w/ antacids... he saw DrBensimhon 3/11> HBP, CAD s/p CABG, Hyperlipidemia & Obesity... atypic CP & Stress Test was neg- no signif ST segment depression... also had ArtDoppler's of LE's w/ norm ABI's & no evid obstruction...  BP controlled on meds;  Chol looks OK on Lovastatin;  wt down sl to 251# but he has a long way to go!   Current Problem List:  ALLERGIC RHINITIS (ICD-477.9) - advised not to use Afrin etc... rather take OTC antihist (Claritin, Zyrtek, etc), Saline, FLONASE Qhs...  OBSTRUCTIVE SLEEP APNEA (ICD-327.23) - on CPAP regularly (can't locate sleep study results), doing well by his report.  HYPERTENSION (ICD-401.9) - well controlled on a 5 drug regimen: LABETALOL 200mg Tid,  CATAPRES 0.1mg Tid,  AMLODIPINE 10mg /d,  LISINOPRIL 40mg /d,  HCTZ 25mg /d...  takes meds regularly and tol well... BP=110/68 today- denies HA, fatigue, visual changes, CP, palipit, dizziness, syncope, dyspnea, edema, etc... needs better diet, continue same meds.  ~  CXR 3/11 is clear, WNL (heart upper limit of norm).  CORONARY ARTERY DISEASE (ICD-414.00) - on above meds + ASA 81mg /d & PLAVIX 75mg /d... S/P CABG x4 9/02 by DrOwen... subseq PTCA/stent to native CIRC 6/03 (& occluded vein graft to LAD noted); last cath = 2/06 w/ PTCA/stent in LAD distal to LIMA touchdown, otherw no change... NuclearStressTest 9/07 showed neg w/ EF=62%...  ~  3/10:  yearly ROV DrBensimhon doing well, no changes made...  ~  3/11:  stable- StressTest was neg- no signif ST seg depression; ArtDopplers LEs showed norm ABIs.  HYPERLIPIDEMIA (ICD-272.4) - back on LOVASTATIN 80mg /d (40mg - 2tabsQhs) due to $$$... + FISH OIL 3/d...  ~  FLP 7/08 on Lovastatin showed TChol 157, TG 127, HDL 30, LDL 101... ch to LIP40 per Cards...  ~  FLP 1/09 on Lip40 showed TChol 125, Tg 105, HDL 31, LDL 73... ch back to Lovastin80 due to $$$...  ~  FLP 7/09 on Lova80 showed TChol 142, TG 111, HDL 31, LDL 89... rec- continue same, get wt down.  ~  FLP 1/10 showed TChol 130, TG 99, HDL 30, LDL 80... rec> get wt down or stronger statin Rx!  ~  FLP 3/11 showed TChol  138, TG 96, HDL 43, LDL 76... continue same.  ~  FLP 8/11 showed TChol 140, TG 145, HDL 33, LDL 78  OBESITY (ICD-278.00)  ~  7/09:  he's done a better job w/ diet + exercise and weight down 13# in 76mo to 242#.  ~  1/10:  unfortunately weight back up 11# over the holidays to 253#... rec- diet/ exercise discussed.  ~  10/10: weight = 260# but pt notes he & wife to start diet soon!!!  ~  3/11:  weight = 260#  ~  8/11:  weight = 251#.Marland Kitchen. states "my wife keeps on bringing it home"  GERD (ICD-530.81) - last EGD by DrSam 3/06 revealed severe antral gastitis.Marland Kitchen. now on PROTONIX 40mg /d, and PEPCID 20mg Qhs...  DIVERTICULOSIS OF COLON (ICD-562.10) & COLONIC POLYPS  (ICD-211.3) - last colonoscopy 7/05 by DrSam showed divertics, hems, otherw neg... f/u planned 8yrs.  RENAL INSUFFICIENCY (ICD-588.9) - he knows to avoid NSAIDs and nephrotoxic drugs...  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  DEGENERATIVE JOINT DISEASE (ICD-715.90) & HYPERURICEMIA (ICD-790.6) - as per eval DrBeane w/ ?gout- Rx'd w/ left knee shot, Indocin... back on ALLOPURINOL 300mg /d now...  ~  labs 3/09 showed Uric= 8.7...  ~  labs 7/09 showed Uric= 5.6.Marland KitchenMarland Kitchen rec- continue Allopurinol.  ~  9/10: dropped timber on left foot w/ hematoma, cellulitis- Rx'd...  ~  labs 9/10 showed Uric= 5.9 on Uloric40.  ~  labs 3/11 showed URIC= 6.3 back on Allopurinol 300mg /d.  BACK PAIN, LUMBAR (ICD-724.2)  ANXIETY (ICD-300.00)   Preventive Screening-Counseling & Management  Alcohol-Tobacco     Smoking Status: quit     Year Started: 1957     Year Quit: 2004     Pack years: 38yrs, 1/2 ppd  Allergies (verified): No Known Drug Allergies  Comments:  Nurse/Medical Assistant: The patient's medications and allergies were reviewed with the patient and were updated in the Medication and Allergy Lists.  Past History:  Past Medical History:  1. CAD     a. s/p stenting of LAD (2006) and LCX (2003) with drug-eluting stents     b. s/p CABG 2002         --LIMA-LAD         --RIMA-PDA         --SVG-Diag1-PL (distal limb occluded)         --SVG-Diag3 (occlided)    c. EF 50-55% echo 2/06  ALLERGIC RHINITIS (ICD-477.9) OBSTRUCTIVE SLEEP APNEA (ICD-327.23) HYPERTENSION (ICD-401.9) CORONARY ARTERY DISEASE (ICD-414.00) HYPERLIPIDEMIA (ICD-272.4) OBESITY (ICD-278.00) GERD (ICD-530.81) DIVERTICULOSIS OF COLON (ICD-562.10) COLONIC POLYPS (ICD-211.3) RENAL INSUFFICIENCY (ICD-588.9) ORGANIC IMPOTENCE  (ICD-607.84) DEGENERATIVE JOINT DISEASE (ICD-715.90) BACK PAIN, LUMBAR (ICD-724.2) HYPERURICEMIA (ICD-790.6) ANXIETY (ICD-300.00)  Past Surgical History: S/P CABG - 2002 Cholecystectomy - S/P Lap Chole 2006 by DrHoxworth  Family History: Reviewed history from 01/07/2008 and no changes required. mother deceased at age 51 due to heart disease father deceased at age 33 to heart disease brother living at 51, hx of heart disease  Social History: Reviewed history from 08/18/2008 and no changes required. Lives in [place] with his significant other.  Tobacco; quit smoking in 2002.  Alcohol; not excessive.  No illicit drugs. quit drinking 6 years ago patient is married with 1 child  Review  of Systems      See HPI       The patient complains of dyspnea on exertion and difficulty walking.  The patient denies anorexia, fever, weight loss, weight gain, vision loss, decreased hearing, hoarseness, chest pain, syncope, peripheral edema, prolonged cough, headaches, hemoptysis, abdominal pain, melena, hematochezia, severe indigestion/heartburn, hematuria, incontinence, muscle weakness, suspicious skin lesions, transient blindness, depression, unusual weight change, abnormal bleeding, enlarged lymph nodes, and angioedema.    Vital Signs:  Patient profile:   65 year old male Height:      69 inches Weight:      250.38 pounds BMI:     37.11 O2 Sat:      96 % on Room air Temp:     97.8 degrees F oral Pulse rate:   51 / minute BP sitting:   110 / 68  (left arm) Cuff size:   large  Vitals Entered By: Randell Loop CMA (February 14, 2010 9:40 AM)  O2 Sat at Rest %:  96 O2 Flow:  Room air CC: 6 month ROV & review of mult medical problems... Is Patient Diabetic? No Pain Assessment Patient in pain? yes      Comments meds updated today with pt   Physical Exam  Additional Exam:  WD, Obese, 65 y/o WM in NAD... GENERAL:  Alert & oriented; pleasant & cooperative... HEENT:  [place]/AT, EOM-wnl,  PERRLA, EACs-clear, TMs-wnl, NOSE-clear, THROAT-clear & wnl. NECK:  Supple w/ fairROM; no JVD; normal carotid impulses w/o bruits; no thyromegaly or nodules palpated; no lymphadenopathy. CHEST:  Median sternotomy scar, clear to P & A; without wheezes/ rales/ or rhonchi. HEART:  Regular Rhythm; without murmurs/ rubs/ or gallops heard. ABDOMEN:  Obese, soft & nontender; normal bowel sounds; no organomegaly or masses detected. EXT: without deformities, mild arthritic changes; no varicose veins/ +venous insuffic, tr edema... NEURO:  CN's intact; motor testing normal; sensory testing normal; gait normal & balance OK. DERM:  No lesions noted; no rash etc...    MISC. Report  Procedure date:  02/14/2010  Findings:      BMP (METABOL)   Sodium                    142 mEq/L                   135-145   Potassium                 4.8 mEq/L                   3.5-5.1   Chloride                  104 mEq/L                   96-112   Carbon Dioxide            29 mEq/L                    19-32   Glucose                   91 mg/dL                    62-95   BUN                       20 mg/dL  6-23   Creatinine                1.3 mg/dL                   1.6-1.0   Calcium                   9.6 mg/dL                   9.6-04.5   GFR                       58.89 mL/min                >60  Lipid Panel (LIPID)   Cholesterol               140 mg/dL                   4-098   Triglycerides             145.0 mg/dL                 1.1-914.7   HDL                  [L]  82.95 mg/dL                 >62.13   LDL Cholesterol           78 mg/dL                    0-86   Impression & Recommendations:  Problem # 1:  OBSTRUCTIVE SLEEP APNEA (ICD-327.23) He continues on CPAP & doing satis...  Problem # 2:  HYPERTENSION (ICD-401.9) BP controlled>  same meds. His updated medication list for this problem includes:    Labetalol Hcl 200 Mg Tabs (Labetalol hcl) .Marland Kitchen... 1 tab by mouth three times a day     Catapres 0.1 Mg Tabs (Clonidine hcl) ..... One tab three times daily    Amlodipine Besylate 10 Mg Tabs (Amlodipine besylate) .Marland Kitchen... 1 tab daily    Lisinopril 40 Mg Tabs (Lisinopril) .Marland Kitchen... 1 tab daily    Hydrochlorothiazide 25 Mg Tabs (Hydrochlorothiazide) .Marland Kitchen... 1 tab daily  Orders: TLB-BMP (Basic Metabolic Panel-BMET) (80048-METABOL) TLB-Lipid Panel (80061-LIPID)  Problem # 3:  CORONARY ARTERY DISEASE (ICD-414.00) Followed by Bensimhon> continue same meds, needs better diet & exercise program... His updated medication list for this problem includes:    Bayer Low Strength 81 Mg Tbec (Aspirin) .Marland Kitchen... Take 1 tablet by mouth once a day    Plavix 75 Mg Tabs (Clopidogrel bisulfate) .Marland Kitchen... Take 1 tablet by mouth once a day    Nitrostat 0.4 Mg Subl (Nitroglycerin) .Marland Kitchen... 1 under tongue as needed for chest pain...    Labetalol Hcl 200 Mg Tabs (Labetalol hcl) .Marland Kitchen... 1 tab by mouth three times a day    Catapres 0.1 Mg Tabs (Clonidine hcl) ..... One tab three times daily    Amlodipine Besylate 10 Mg Tabs (Amlodipine besylate) .Marland Kitchen... 1 tab daily    Lisinopril 40 Mg Tabs (Lisinopril) .Marland Kitchen... 1 tab daily    Hydrochlorothiazide 25 Mg Tabs (Hydrochlorothiazide) .Marland Kitchen... 1 tab daily  Problem # 4:  HYPERLIPIDEMIA (ICD-272.4) Stable on the Mevacor + diet Rx, rec- get wt down... His updated medication list for this problem includes:    Lovastatin 40 Mg Tabs (Lovastatin) .Marland Kitchen... Take two  tablets by mouth daily at  bedtime  Problem # 5:  OBESITY (ICD-278.00) Weight reduction is key & he understands the importance...  Problem # 6:  GERD (ICD-530.81) GERD, Divertics, hx polyps> all stable... His updated medication list for this problem includes:    Protonix 40 Mg Tbec (Pantoprazole sodium) .Marland Kitchen... 1 by mouth once daily    Levsin 0.125 Mg Tabs (Hyoscyamine sulfate) .Marland Kitchen... 1 under tongue every 4hrs as needed abdominal cramping  Problem # 7:  RENAL INSUFFICIENCY (ICD-588.9) Renal is stable... same meds.  Problem # 8:   DEGENERATIVE JOINT DISEASE (ICD-715.90) He has DJD, LBP, & incr Uric on Allopurinol... continue same... His updated medication list for this problem includes:    Bayer Low Strength 81 Mg Tbec (Aspirin) .Marland Kitchen... Take 1 tablet by mouth once a day    Vicodin 5-500 Mg Tabs (Hydrocodone-acetaminophen) .Marland Kitchen... 1 by mouth every 4-6 hr as needed pain  Complete Medication List: 1)  Allegra 180 Mg Tabs (Fexofenadine hcl) .... Take 1 tablet by mouth once a day 2)  Fluticasone Propionate 50 Mcg/act Susp (Fluticasone propionate) .... 2 sp in each nostril at bedtime.Marland KitchenMarland Kitchen 3)  Bayer Low Strength 81 Mg Tbec (Aspirin) .... Take 1 tablet by mouth once a day 4)  Plavix 75 Mg Tabs (Clopidogrel bisulfate) .... Take 1 tablet by mouth once a day 5)  Nitrostat 0.4 Mg Subl (Nitroglycerin) .Marland Kitchen.. 1 under tongue as needed for chest pain.Marland KitchenMarland Kitchen 6)  Labetalol Hcl 200 Mg Tabs (Labetalol hcl) .Marland Kitchen.. 1 tab by mouth three times a day 7)  Catapres 0.1 Mg Tabs (Clonidine hcl) .... One tab three times daily 8)  Amlodipine Besylate 10 Mg Tabs (Amlodipine besylate) .Marland Kitchen.. 1 tab daily 9)  Lisinopril 40 Mg Tabs (Lisinopril) .Marland Kitchen.. 1 tab daily 10)  Hydrochlorothiazide 25 Mg Tabs (Hydrochlorothiazide) .Marland Kitchen.. 1 tab daily 11)  Lovastatin 40 Mg Tabs (Lovastatin) .... Take two  tablets by mouth daily at bedtime 12)  Fish Oil Oil (Fish oil) .Marland Kitchen.. 1200 mg three times a day 13)  Protonix 40 Mg Tbec (Pantoprazole sodium) .Marland Kitchen.. 1 by mouth once daily 14)  Levsin 0.125 Mg Tabs (Hyoscyamine sulfate) .Marland Kitchen.. 1 under tongue every 4hrs as needed abdominal cramping 15)  Allopurinol 300 Mg Tabs (Allopurinol) .... Take 1 tablet by mouth once a day 16)  Vicodin 5-500 Mg Tabs (Hydrocodone-acetaminophen) .Marland Kitchen.. 1 by mouth every 4-6 hr as needed pain 17)  Acyclovir 200 Mg Caps (Acyclovir) .... Take 3 tabs daily as directed...  Patient Instructions: 1)  Today we updated your med list- see below.... 2)  Continue your current meds the same... 3)  Today we did your follow up FASTING  blood work... please call the "phone tree" in a few days for your lab results.Marland KitchenMarland Kitchen  4)  Call for any problems.Marland KitchenMarland Kitchen 5)  Let's get on track w/ our diet + exercise program- the goal is to lose 15-20 lbs!!! 6)  Please schedule a follow-up appointment in 6 months.

## 2010-07-18 NOTE — Progress Notes (Signed)
   Recieved ROI via Mail for Pt. Sent to [employer] [facility]  December 06, 2009 8:37 AM

## 2010-07-18 NOTE — Progress Notes (Signed)
Summary: lab results  Phone Note Call from Patient Call back at Home Phone [phone]   Caller: Patient Call For: nadel Summary of Call: pt wants results of labs. he called for several days and they had not been avail.  Initial call taken by: Tivis Ringer, CNA,  February 22, 2010 9:24 AM  Follow-up for Phone Call        Results are on phone tree. LMOM telling pt to call [phone] and after entering his med rec number, which i left in the msg, he can get results. To callback if he has any problems or questions.Michel Bickers [facility]  February 22, 2010 12:25 PM

## 2010-07-18 NOTE — Assessment & Plan Note (Signed)
Summary: Neck Pain   Primary Provider/Referring Provider:  ScottNadel, MD  CC:  pain in left side of neck described as a sharp pain similar to a pulled muscle x4month.  History of Present Illness: 65 year old Green patient with OSA-nocturnal CPAP, HTN, CAD, hyperlipidemia and Gout.    ~  2/09 f/u w/ DrBensimhon: stable- changed from Lipitor back to Lovastatin due to $$$.  ~  3/09 Ortho f/u DrBeane w/ left knee pain- ?Gout, given shot & Indocin/ Allopurinol Rx...  ~  5/09 f/u DrBensimhon w/ atypic CP & indigestion- Myoview was non-ischemic, PPI added.  ~  6/09 saw TP w/ GERD symptoms- Rx Protonix, Pepcid, Levsin... Improved...  ~  8/09 saw TP w/ bruise behind left knee= hematoma w/ VenDoppler 2 x 2.5 cm mass, now resolved.   March 02, 2009 --Presents for an acute office visit. dropped landscaping timber on left foot 1.5weeks - bruised, sore to touch, difficult to walk, knot on top of foot, swelling has gone down. Bears weight but top of foot bruised and sore. . Pt request to switch to Uloric instead of Alloupurinol. --xray neg for fx.   03/11/09--Returns for persistent pain/swelling in right foot. Complains still having pain in right foot, still has knot and redness/bruising - states pain improves when elevated. Has knot in mid right foot, redness has increased, denies fever or calf pain, dyspnea. Pain is worse w/ foot in dependent position. Can walk on foot without significant pain.    ~  August 17, 2009:  his CC is arthritis in his left knee- prev eval by Rolla Plate w/ shot... he has Vicodin for Prn use & a brace for exercise program... he will f/u w/ ortho when needed...  BP controlled on meds;  no episodes of CP, etc;  Chol OK on Lovastatin, but unfortunately he hasn't gotten his weight down...  December 12, 2009--Presents for a work in visit. Complains that he has a small red, scaly spot on right arm toward elbow x6month.  states is began as similar to a water blister but has not healed.   denies any trauma to that arm.  denies pain, but states is itching.Has several moles , sun spots on arms and scalp, face. no weight loss, no dark discoloration.     several sun spots on arms December 29, 2009--Presents for left sided neck pain/stiff x 1 month. Feels like he slept wrong on neck. "Crick in neck". Sore to touch, and worse w/ turning neck or looking up/down. no extremity weakness. known injury. no radiuclar symptoms. Pain is bad in am when he gets up, some better after shower then worse in late evening /night. Denies chest pain,   orthopnea, hemoptysis, fever, n/v/d, edema, headache.  Has went to dermatolgy w/ bx of moles, waiting for resutls.    Medications Prior to Update: 1)  Allegra 180 Mg Tabs (Fexofenadine Hcl) .... Take 1 Tablet By Mouth Once A Day 2)  Fluticasone Propionate 50 Mcg/act Susp (Fluticasone Propionate) .... 2 Sp in Each Nostril At Bedtime.Marland KitchenMarland Kitchen 3)  Bayer Low Strength 81 Mg  Tbec (Aspirin) .... Take 1 Tablet By Mouth Once A Day 4)  Plavix 75 Mg  Tabs (Clopidogrel Bisulfate) .... Take 1 Tablet By Mouth Once A Day 5)  Nitrostat 0.4 Mg  Subl (Nitroglycerin) .Marland Kitchen.. 1 Under Tongue As Needed For Chest Pain... 6)  Labetalol Hcl 200 Mg  Tabs (Labetalol Hcl) .Marland Kitchen.. 1 Tab By Mouth Three Times A Day 7)  Catapres 0.1 Mg  Tabs (Clonidine Hcl) .... One Tab Three Times Daily 8)  Amlodipine Besylate 10 Mg  Tabs (Amlodipine Besylate) .Marland Kitchen.. 1 Tab Daily 9)  Lisinopril 40 Mg  Tabs (Lisinopril) .Marland Kitchen.. 1 Tab Daily 10)  Hydrochlorothiazide 25 Mg  Tabs (Hydrochlorothiazide) .Marland Kitchen.. 1 Tab Daily 11)  Lovastatin 40 Mg Tabs (Lovastatin) .... Take Two  Tablets By Mouth Daily At Bedtime 12)  Fish Oil   Oil (Fish Oil) .Marland Kitchen.. 1200 Mg Three Times A Day 13)  Protonix 40 Mg  Tbec (Pantoprazole Sodium) .Marland Kitchen.. 1 By Mouth Once Daily 14)  Levsin 0.125 Mg  Tabs (Hyoscyamine Sulfate) .Marland Kitchen.. 1 Under Tongue Every 4hrs As Needed Abdominal Cramping 15)  Allopurinol 300 Mg Tabs (Allopurinol) .... Take 1 Tablet By Mouth Once A  Day 16)  Acyclovir 200 Mg  Caps (Acyclovir) .... Take 3 Tabs Daily As Directed... 17)  Vicodin 5-500 Mg Tabs (Hydrocodone-Acetaminophen) .Marland Kitchen.. 1 By Mouth Every 4-6 Hr As Needed Pain  Current Medications (verified): 1)  Allegra 180 Mg Tabs (Fexofenadine Hcl) .... Take 1 Tablet By Mouth Once A Day 2)  Fluticasone Propionate 50 Mcg/act Susp (Fluticasone Propionate) .... 2 Sp in Each Nostril At Bedtime.Marland KitchenMarland Kitchen 3)  Bayer Low Strength 81 Mg  Tbec (Aspirin) .... Take 1 Tablet By Mouth Once A Day 4)  Plavix 75 Mg  Tabs (Clopidogrel Bisulfate) .... Take 1 Tablet By Mouth Once A Day 5)  Nitrostat 0.4 Mg  Subl (Nitroglycerin) .Marland Kitchen.. 1 Under Tongue As Needed For Chest Pain... 6)  Labetalol Hcl 200 Mg  Tabs (Labetalol Hcl) .Marland Kitchen.. 1 Tab By Mouth Three Times A Day 7)  Catapres 0.1 Mg  Tabs (Clonidine Hcl) .... One Tab Three Times Daily 8)  Amlodipine Besylate 10 Mg  Tabs (Amlodipine Besylate) .Marland Kitchen.. 1 Tab Daily 9)  Lisinopril 40 Mg  Tabs (Lisinopril) .Marland Kitchen.. 1 Tab Daily 10)  Hydrochlorothiazide 25 Mg  Tabs (Hydrochlorothiazide) .Marland Kitchen.. 1 Tab Daily 11)  Lovastatin 40 Mg Tabs (Lovastatin) .... Take Two  Tablets By Mouth Daily At Bedtime 12)  Fish Oil   Oil (Fish Oil) .Marland Kitchen.. 1200 Mg Three Times A Day 13)  Protonix 40 Mg  Tbec (Pantoprazole Sodium) .Marland Kitchen.. 1 By Mouth Once Daily 14)  Levsin 0.125 Mg  Tabs (Hyoscyamine Sulfate) .Marland Kitchen.. 1 Under Tongue Every 4hrs As Needed Abdominal Cramping 15)  Allopurinol 300 Mg Tabs (Allopurinol) .... Take 1 Tablet By Mouth Once A Day 16)  Acyclovir 200 Mg  Caps (Acyclovir) .... Take 3 Tabs Daily As Directed... 17)  Vicodin 5-500 Mg Tabs (Hydrocodone-Acetaminophen) .Marland Kitchen.. 1 By Mouth Every 4-6 Hr As Needed Pain 18)  Soma 250 Mg Tabs (Carisoprodol) .... Take 1 Tablet By Mouth Three Times A Day As Needed Muscle Spasm  Allergies (verified): No Known Drug Allergies  Past History:  Past Medical History: Last updated: 08/17/2009  1. CAD     a. s/p stenting of LAD (2006) and LCX (2003) with drug-eluting  stents     b. s/p CABG 2002         --LIMA-LAD         --RIMA-PDA         --SVG-Diag1-PL (distal limb occluded)         --SVG-Diag3 (occlided)    c. EF 50-55% echo 2/06  ALLERGIC RHINITIS (ICD-477.9) OBSTRUCTIVE SLEEP APNEA (ICD-327.23) HYPERTENSION (ICD-401.9) CORONARY ARTERY DISEASE (ICD-414.00) HYPERLIPIDEMIA (ICD-272.4) OBESITY (ICD-278.00) GERD (ICD-530.81) DIVERTICULOSIS OF COLON (ICD-562.10) COLONIC POLYPS (ICD-211.3) RENAL INSUFFICIENCY (ICD-588.9) ORGANIC IMPOTENCE (ICD-607.84) DEGENERATIVE JOINT DISEASE (ICD-715.90) BACK PAIN, LUMBAR (ICD-724.2) HYPERURICEMIA (ICD-790.6) ANXIETY (  ICD-300.00)  Past Surgical History: Last updated: 08/17/2009 S/P CABG - 2002 Cholecystectomy - S/P Lap Chole 2006 by DrHoxworth  Family History: Last updated: 01/24/2008 mother deceased at age 52 due to heart disease father deceased at age 26 to heart disease brother living at 39, hx of heart disease  Social History: Last updated: 08/18/2008 Lives in [place] with his significant other.  Tobacco; quit smoking in 2002.  Alcohol; not excessive.  No illicit drugs. quit drinking 6 years ago patient is married with 1 child  Risk Factors: Smoking Status: quit (12/12/2009)  Review of Systems      See HPI  Vital Signs:  Patient profile:   65 year old Green Height:      69 inches Weight:      246 pounds BMI:     36.46 O2 Sat:      94 % on Room air Temp:     97.6 degrees F oral Pulse rate:   52 / minute BP sitting:   120 / 68  (left arm) Cuff size:   regular  Vitals Entered By: Boone Master CNA/MA (December 29, 2009 10:43 AM)  O2 Flow:  Room air CC: pain in left side of neck described as a sharp pain similar to a pulled muscle x49month Is Patient Diabetic? No Comments Medications reviewed with patient Daytime contact number verified with patient. Boone Master CNA/MA  December 29, 2009 10:42 AM    Physical Exam  Additional Exam:  WD, Obese, 65 y/o WM in NAD... GENERAL:   Alert & oriented; pleasant & cooperative... HEENT:  [place]/AT, EOM-wnl, PERRLA, EACs-clear, TMs-wnl, NOSE-clear, THROAT-clear & wnl. NECK:  Supple w/ fairROM; no JVD; normal carotid impulses w/o bruits; no thyromegaly or nodules palpated; no lymphadenopathy. CHEST:  Median sternotomy scar, clear to P & A; without wheezes/ rales/ or rhonchi. HEART:  Regular Rhythm; without murmurs/ rubs/ or gallops heard. ABDOMEN:  Soft & nontender; normal bowel sounds; no organomegaly or masses detected. EXT: without deformities, mild arthritic changes; no varicose veins/ +venous insuffic,  Neuro: nml grips bilaterally, MAEW, equal strength, nml gait, no focal deficits noted  Muscul. tender along left lateral neck, rom nml w/ reproducible pain on lateral rom.      Impression & Recommendations:  Problem # 1:  DEGENERATIVE JOINT DISEASE (ICD-715.90)  Neck strain REC Motrin 200mg  3 tabs two times a day w/ food for 5-7 days  Skelaxin 800mg  three times a day as needed muscle spasm  Heat compresses to neck for 20 min two times a day as needed  Light stretches daily as tolerated.  Increase fluid intake, water.  Please contact office for sooner follow up if symptoms do not improve or worsen  His updated medication list for this problem includes:    Bayer Low Strength 81 Mg Tbec (Aspirin) .Marland Kitchen... Take 1 tablet by mouth once a day    Vicodin 5-500 Mg Tabs (Hydrocodone-acetaminophen) .Marland Kitchen... 1 by mouth every 4-6 hr as needed pain  Orders: Est. Patient Level III ([address])  Medications Added to Medication List This Visit: 1)  Skelaxin 800 Mg Tabs (Metaxalone) .Marland Kitchen.. 1 by mouth three times a day as needed muscle spasm. 2)  Soma 250 Mg Tabs (Carisoprodol) .... Take 1 tablet by mouth three times a day as needed muscle spasm  Complete Medication List: 1)  Allegra 180 Mg Tabs (Fexofenadine hcl) .... Take 1 tablet by mouth once a day 2)  Fluticasone Propionate 50 Mcg/act Susp (Fluticasone propionate) .... 2 sp in each nostril  at bedtime.Marland KitchenMarland Kitchen 3)  Bayer Low Strength 81 Mg Tbec (Aspirin) .... Take 1 tablet by mouth once a day 4)  Plavix 75 Mg Tabs (Clopidogrel bisulfate) .... Take 1 tablet by mouth once a day 5)  Nitrostat 0.4 Mg Subl (Nitroglycerin) .Marland Kitchen.. 1 under tongue as needed for chest pain.Marland KitchenMarland Kitchen 6)  Labetalol Hcl 200 Mg Tabs (Labetalol hcl) .Marland Kitchen.. 1 tab by mouth three times a day 7)  Catapres 0.1 Mg Tabs (Clonidine hcl) .... One tab three times daily 8)  Amlodipine Besylate 10 Mg Tabs (Amlodipine besylate) .Marland Kitchen.. 1 tab daily 9)  Lisinopril 40 Mg Tabs (Lisinopril) .Marland Kitchen.. 1 tab daily 10)  Hydrochlorothiazide 25 Mg Tabs (Hydrochlorothiazide) .Marland Kitchen.. 1 tab daily 11)  Lovastatin 40 Mg Tabs (Lovastatin) .... Take two  tablets by mouth daily at bedtime 12)  Fish Oil Oil (Fish oil) .Marland Kitchen.. 1200 mg three times a day 13)  Protonix 40 Mg Tbec (Pantoprazole sodium) .Marland Kitchen.. 1 by mouth once daily 14)  Levsin 0.125 Mg Tabs (Hyoscyamine sulfate) .Marland Kitchen.. 1 under tongue every 4hrs as needed abdominal cramping 15)  Allopurinol 300 Mg Tabs (Allopurinol) .... Take 1 tablet by mouth once a day 16)  Acyclovir 200 Mg Caps (Acyclovir) .... Take 3 tabs daily as directed... 17)  Vicodin 5-500 Mg Tabs (Hydrocodone-acetaminophen) .Marland Kitchen.. 1 by mouth every 4-6 hr as needed pain 18)  Soma 250 Mg Tabs (Carisoprodol) .... Take 1 tablet by mouth three times a day as needed muscle spasm  Patient Instructions: 1)  Motrin 200mg  3 tabs two times a day w/ food for 5-7 days  2)  Skelaxin 800mg  three times a day as needed muscle spasm  3)  Heat compresses to neck for 20 min two times a day as needed  4)  Light stretches daily as tolerated.  5)  Increase fluid intake, water.  6)  Please contact office for sooner follow up if symptoms do not improve or worsen  Prescriptions: SOMA 250 MG TABS (CARISOPRODOL) Take 1 tablet by mouth three times a day as needed muscle spasm  #30 x 0   Entered by:   Boone Master CNA/MA   Authorized by:   Rubye Oaks NP   Signed by:   Boone Master CNA/MA on 12/29/2009   Method used:   Telephoned to ...       [facility]* (retail)       [address] [place], [place]  [address]       Ph: [phone] or [phone]       Fax: [phone]   RxID:   [id number] SKELAXIN 800 MG TABS (METAXALONE) 1 by mouth three times a day as needed muscle spasm.  #20 x 0   Entered and Authorized by:   Rubye Oaks NP   Signed by:   Tammy Parrett NP on 12/29/2009   Method used:   Electronically to        [facility]* (retail)       [address] [place], [place]  [address]       Ph: [phone] or [phone]       Fax: [phone]   RxID:   [id number]    Immunization History:  Pneumovax Immunization History:    Pneumovax:  historical (06/18/2009)

## 2010-07-18 NOTE — Progress Notes (Signed)
Summary: chest pain   Phone Note Call from Patient   Caller: Patient Reason for Call: Talk to Nurse, Talk to Doctor Summary of Call: pt having pain in shoulder,neck and chest.  Initial call taken by: Omer Jack,  January 05, 2010 1:19 PM  Follow-up for Phone Call        Spoke with patient...he states that he is having neck and shoulder pains for several months but is worse lately. He is currently on a vacation at the [place]. He states that his neck really hurts when he turns his head from side to side. He was calling to to make an appointment with Tammy P. to get an xray of his neck. I advised him that his wife called the wrong dept. and I transferred him to the Pul Dept where Dr.Nadel and Tammy P are located. Follow-up by: Suzan Garibaldi RN

## 2010-07-18 NOTE — Letter (Signed)
Summary: CMN/[employer] [facility] [employer]  CMN/[employer]   Imported By: Lester [place] 08/26/2009 10:12:22  _____________________________________________________________________  External Attachment:    Type:   Image     Comment:   External Document

## 2010-07-18 NOTE — Letter (Signed)
Summary: New Patient letter  [facility]  [address] [place], [place] [address]   Phone: [phone]  Fax: [phone]       05/25/2010 MRN: [id number]  [facility] [address] [place], [place]  [address]  Dear Mr. Kliethermes,  Welcome to the Gastroenterology Division at [facility].    You are scheduled to see Dr.  Marina Goodell on 06-21-2010 at 9:30am on the 3rd floor at [facility], [address]  We ask that you try to arrive at our office 15 minutes prior to your appointment time to allow for check-in.  We would like you to complete the enclosed self-administered evaluation form prior to your visit and bring it with you on the day of your appointment.  We will review it with you.  Also, please bring a complete list of all your medications or, if you prefer, bring the medication bottles and we will list them.  Please bring your insurance card so that we may make a copy of it.  If your insurance requires a referral to see a specialist, please bring your referral form from your primary care physician.  Co-payments are due at the time of your visit and may be paid by cash, check or credit card.     Your office visit will consist of a consult with your physician (includes a physical exam), any laboratory testing he/she may order, scheduling of any necessary diagnostic testing (e.g. x-ray, ultrasound, CT-scan), and scheduling of a procedure (e.g. Endoscopy, Colonoscopy) if required.  Please allow enough time on your schedule to allow for any/all of these possibilities.    If you cannot keep your appointment, please call [phone] to cancel or reschedule prior to your appointment date.  This allows [place] the opportunity to schedule an appointment for another patient in need of care.  If you do not cancel or reschedule by 5 p.m. the business day prior to your appointment date, you will be charged a $50.00 late cancellation/no-show fee.    Thank you for choosing [facility] for your medical needs.  We appreciate the opportunity to care for you.  Please visit [place] at our website  to learn more about our practice.                     Sincerely,                                                             The Gastroenterology Division

## 2010-07-18 NOTE — Assessment & Plan Note (Signed)
Summary: C6C      Allergies Added: NKDA  Visit Type:  Follow-up Primary Provider:  Joneen Roach, MD  CC:  Follow-up.  History of Present Illness: [name] is a very pleasant 65 year old male with a history of CAD s/p CABG, HTN, HL and obesity. he returns today with his wife for routine followup.  Returns for follow-up. Typically tries to walk 2 miles a day. Often limited by pain in his hip and wonders if it is PAD vs arthritis. No calf pain. Has occasional CP and burning not sure if it is GERD or his heart. Using low carb diet and has lost almost 10 pounds. No orthopnea, PND. + mild edema.  Recent lipids with Dr. Kriste Basque TC 138 HDL 43 LDL 76 TG 96  Current Medications (verified): 1)  Claritin 10 Mg  Tabs (Loratadine) .... Take 1 Tablet By Mouth Once A Day 2)  Fluticasone Propionate 50 Mcg/act Susp (Fluticasone Propionate) .... 2 Sp in Each Nostril At Bedtime.Marland KitchenMarland Kitchen 3)  Bayer Low Strength 81 Mg  Tbec (Aspirin) .... Take 1 Tablet By Mouth Once A Day 4)  Plavix 75 Mg  Tabs (Clopidogrel Bisulfate) .... Take 1 Tablet By Mouth Once A Day 5)  Nitrostat 0.4 Mg  Subl (Nitroglycerin) .Marland Kitchen.. 1 Under Tongue As Needed For Chest Pain... 6)  Labetalol Hcl 200 Mg  Tabs (Labetalol Hcl) .Marland Kitchen.. 1 Tab By Mouth Three Times A Day 7)  Catapres 0.1 Mg  Tabs (Clonidine Hcl) .... One Tab Three Times Daily 8)  Amlodipine Besylate 10 Mg  Tabs (Amlodipine Besylate) .Marland Kitchen.. 1 Tab Daily 9)  Lisinopril 40 Mg  Tabs (Lisinopril) .Marland Kitchen.. 1 Tab Daily 10)  Hydrochlorothiazide 25 Mg  Tabs (Hydrochlorothiazide) .Marland Kitchen.. 1 Tab Daily 11)  Lovastatin 40 Mg Tabs (Lovastatin) .... Take Two  Tablets By Mouth Daily At Bedtime 12)  Fish Oil   Oil (Fish Oil) .Marland Kitchen.. 1200 Mg Three Times A Day 13)  Protonix 40 Mg  Tbec (Pantoprazole Sodium) .Marland Kitchen.. 1 By Mouth Once Daily 14)  Levsin 0.125 Mg  Tabs (Hyoscyamine Sulfate) .Marland Kitchen.. 1 Under Tongue Every 4hrs As Needed Abdominal Cramping 15)  Allopurinol 300 Mg Tabs (Allopurinol) .... Take 1 Tablet By Mouth Once A Day 16)   Acyclovir 200 Mg  Caps (Acyclovir) .... Take 3 Tabs Daily As Directed... 17)  Vicodin 5-500 Mg Tabs (Hydrocodone-Acetaminophen) .Marland Kitchen.. 1 By Mouth Every 4-6 Hr As Needed Pain  Allergies (verified): No Known Drug Allergies  Past History:  Past Medical History: Last updated: 08/17/2009  1. CAD     a. s/p stenting of LAD (2006) and LCX (2003) with drug-eluting stents     b. s/p CABG 2002         --LIMA-LAD         --RIMA-PDA         --SVG-Diag1-PL (distal limb occluded)         --SVG-Diag3 (occlided)    c. EF 50-55% echo 2/06  ALLERGIC RHINITIS (ICD-477.9) OBSTRUCTIVE SLEEP APNEA (ICD-327.23) HYPERTENSION (ICD-401.9) CORONARY ARTERY DISEASE (ICD-414.00) HYPERLIPIDEMIA (ICD-272.4) OBESITY (ICD-278.00) GERD (ICD-530.81) DIVERTICULOSIS OF COLON (ICD-562.10) COLONIC POLYPS (ICD-211.3) RENAL INSUFFICIENCY (ICD-588.9) ORGANIC IMPOTENCE (ICD-607.84) DEGENERATIVE JOINT DISEASE (ICD-715.90) BACK PAIN, LUMBAR (ICD-724.2) HYPERURICEMIA (ICD-790.6) ANXIETY (ICD-300.00)  Review of Systems       As per HPI and past medical history; otherwise all systems negative.   Vital Signs:  Patient profile:   65 year old male Height:      69 inches Weight:      251 pounds BMI:  37.20 Pulse rate:   54 / minute BP sitting:   108 / 76  (left arm)  Vitals Entered By: Laurance Flatten CMA (August 25, 2009 9:41 AM)  Physical Exam  General:  Gen: well appearing. no resp difficulty HEENT: normal Neck: supple. no JVD. Carotids 2+ bilat; not bruits. No lymphadenopathy or thryomegaly appreciated. Cor: PMI nondisplaced. Bradycardic. regular rhythm. No rubs, gallops, murmur. Lungs: clear Abdomen: obese soft, nontender, nondistended.  Good bowel sounds. Extremities: no cyanosis, clubbing, rash, edema. DPs 2+ bilat Neuro: alert & orientedx3, cranial nerves grossly intact. moves all 4 extremities w/o difficulty. affect pleasant    Impression & Recommendations:  Problem # 1:  CHEST PAIN, PRECORDIAL  (ICD-786.51) Mostly atypical but has been a long time since he has had stress test to evalaute his coronary status. Will proceed with ETT.  Problem # 2:  HIP PAIN, LEFT (ICD-719.45) Suspect this is arthritic but is at risk for PAD. Will check exercise ABIs.  Problem # 3:  HYPERLIPIDEMIA (ICD-272.4) Lipids look great. Continue lovastatin.  Other Orders: EKG w/ Interpretation (93000) Arterial Duplex Lower Extremity (Arterial Duplex Low) Treadmill (Treadmill)  Patient Instructions: 1)  Your physician has requested that you have an ankle brachial index (ABI). During this test an ultrasound and blood pressure cuff are used to evaluate the arteries that supply the arms and legs with blood. Allow thirty minutes for this exam. There are no restrictions or special instructions.  Exercise ABI's. 2)  Your physician has requested that you have an exercise tolerance test.  For further information please visit [email or web address]  Please also follow instruction sheet, as given. 3)  Follow up in 1 year

## 2010-07-18 NOTE — Progress Notes (Signed)
Summary: talk to nurse  Phone Note Call from Patient Call back at Home Phone [phone] Call back at [phone]   Caller: Patient Call For: nadel Summary of Call: Pt c/o dizziness,nausea x2 weeks, bp 117/74 p53, pls advise.//[facility] Initial call taken by: Darletta Moll,  May 08, 2010 9:41 AM  Follow-up for Phone Call        Pt c/o having dizziness, nausea, and heartburn after meals. Pt is currently on pepcid and protonix. Pt wanted to know to know are these GERD related symptoms. I advised the nausea and heartburm could be. Pt wanted to know what could be causing the dizziness? Pt then mentions he has also been having some numbness and tingling in his left arm. I asked if this happens at the same time as the other symptoms and pt states sometimes it does. All of these symptoms have been occuring x 2 weeks. Please advise.Carron Curie CMA  May 08, 2010 10:52 AM   Additional Follow-up for Phone Call Additional follow up Details #1::        per SN----yes this sounds like GERD---try protonix  1 by mouth two times a day  30 mins before breakfast and dinner and take pepcid 1-2 by mouth at bedtime  will need follow up with GI asap.  thanks Randell Loop CMA  May 08, 2010 2:57 PM     Additional Follow-up for Phone Call Additional follow up Details #2::    [email or web address]   Philipp Deputy [facility]  May 08, 2010 3:10 PM   Pt advised of recs. Carron Curie CMA  May 09, 2010 12:18 PM

## 2010-07-18 NOTE — Progress Notes (Signed)
Summary: spot on arm  Phone Note Call from Patient Call back at Home Phone [phone] Call back at [phone]    Caller: Patient Call For: Muhanad Torosyan Reason for Call: Talk to Nurse, Referral Summary of Call: pt has a spot on his right arm between elbow & shoulder.  ? whether it was/is cancer.  Wants you to either see him or refer him to a new drrmatologist.  Would like SN to look at first and dtermine whether to do xray or not. Initial call taken by: Eugene Gavia,  December 09, 2009 9:05 AM  Follow-up for Phone Call        pt has a spot on his arm--not healing up and its been there x 1 month--started out like a water blister---then started using the neosporin--but not healing now.  he is concerned about it being skin cancer??  appt made for pt to see TP on monday at 4.  pt is aware Randell Loop CMA  December 09, 2009 9:12 AM

## 2010-07-18 NOTE — Assessment & Plan Note (Signed)
Summary: NP follow up - spot right arm   Primary Provider/Referring Provider:  ScottNadel, MD  CC:  small red, scaly spot on right arm toward elbow x46month.  states is began as similar to a water blister but has not healed.  denies any trauma to that arm.  denies pain, and but states is itching.Marland Kitchen  History of Present Illness: 65 year old male patient with OSA-nocturnal CPAP, HTN, CAD, hyperlipidemia and Gout.    ~  2/09 f/u w/ DrBensimhon: stable- changed from Lipitor back to Lovastatin due to $$$.  ~  3/09 Ortho f/u DrBeane w/ left knee pain- ?Gout, given shot & Indocin/ Allopurinol Rx...  ~  5/09 f/u DrBensimhon w/ atypic CP & indigestion- Myoview was non-ischemic, PPI added.  ~  6/09 saw TP w/ GERD symptoms- Rx Protonix, Pepcid, Levsin... Improved...  ~  8/09 saw TP w/ bruise behind left knee= hematoma w/ VenDoppler 2 x 2.5 cm mass, now resolved.   March 02, 2009 --Presents for an acute office visit. dropped landscaping timber on left foot 1.5weeks - bruised, sore to touch, difficult to walk, knot on top of foot, swelling has gone down. Bears weight but top of foot bruised and sore. . Pt request to switch to Uloric instead of Alloupurinol. --xray neg for fx.   03/11/09--Returns for persistent pain/swelling in right foot. Complains still having pain in right foot, still has knot and redness/bruising - states pain improves when elevated. Has knot in mid right foot, redness has increased, denies fever or calf pain, dyspnea. Pain is worse w/ foot in dependent position. Can walk on foot without significant pain.    ~  August 17, 2009:  his CC is arthritis in his left knee- prev eval by Roll[facility] Plate w/ shot... he has Vicodin for Prn use & a brace for exercise program... he will f/u w/ ortho when needed...  BP controlled on meds;  no episodes of CP, etc;  Chol OK on Lovastatin, but unfortunately he hasn't gotten his weight down...  December 12, 2009--Presents for a work in visit. Complains that he  has a small red, scaly spot on right arm toward elbow x47month.  states is began as similar to a water blister but has not healed.  denies any trauma to that arm.  denies pain, but states is itching.Has several moles , sun spots on arms and scalp, face. no weight loss, no dark discoloration.     several sun spots on arms   Preventive Screening-Counseling & Management  Alcohol-Tobacco     Smoking Status: quit  Medications Prior to Update: 1)  Claritin 10 Mg  Tabs (Loratadine) .... Take 1 Tablet By Mouth Once A Day 2)  Fluticasone Propionate 50 Mcg/act Susp (Fluticasone Propionate) .... 2 Sp in Each Nostril At Bedtime.Marland KitchenMarland Kitchen 3)  Bayer Low Strength 81 Mg  Tbec (Aspirin) .... Take 1 Tablet By Mouth Once A Day 4)  Plavix 75 Mg  Tabs (Clopidogrel Bisulfate) .... Take 1 Tablet By Mouth Once A Day 5)  Nitrostat 0.4 Mg  Subl (Nitroglycerin) .Marland Kitchen.. 1 Under Tongue As Needed For Chest Pain... 6)  Labetalol Hcl 200 Mg  Tabs (Labetalol Hcl) .Marland Kitchen.. 1 Tab By Mouth Three Times A Day 7)  Catapres 0.1 Mg  Tabs (Clonidine Hcl) .... One Tab Three Times Daily 8)  Amlodipine Besylate 10 Mg  Tabs (Amlodipine Besylate) .Marland Kitchen.. 1 Tab Daily 9)  Lisinopril 40 Mg  Tabs (Lisinopril) .Marland Kitchen.. 1 Tab Daily 10)  Hydrochlorothiazide 25 Mg  Tabs (  Hydrochlorothiazide) .Marland Kitchen.. 1 Tab Daily 11)  Lovastatin 40 Mg Tabs (Lovastatin) .... Take Two  Tablets By Mouth Daily At Bedtime 12)  Fish Oil   Oil (Fish Oil) .Marland Kitchen.. 1200 Mg Three Times A Day 13)  Protonix 40 Mg  Tbec (Pantoprazole Sodium) .Marland Kitchen.. 1 By Mouth Once Daily 14)  Levsin 0.125 Mg  Tabs (Hyoscyamine Sulfate) .Marland Kitchen.. 1 Under Tongue Every 4hrs As Needed Abdominal Cramping 15)  Allopurinol 300 Mg Tabs (Allopurinol) .... Take 1 Tablet By Mouth Once A Day 16)  Acyclovir 200 Mg  Caps (Acyclovir) .... Take 3 Tabs Daily As Directed... 17)  Vicodin 5-500 Mg Tabs (Hydrocodone-Acetaminophen) .Marland Kitchen.. 1 By Mouth Every 4-6 Hr As Needed Pain  Current Medications (verified): 1)  Allegra 180 Mg Tabs (Fexofenadine  Hcl) .... Take 1 Tablet By Mouth Once A Day 2)  Fluticasone Propionate 50 Mcg/act Susp (Fluticasone Propionate) .... 2 Sp in Each Nostril At Bedtime.Marland KitchenMarland Kitchen 3)  Bayer Low Strength 81 Mg  Tbec (Aspirin) .... Take 1 Tablet By Mouth Once A Day 4)  Plavix 75 Mg  Tabs (Clopidogrel Bisulfate) .... Take 1 Tablet By Mouth Once A Day 5)  Nitrostat 0.4 Mg  Subl (Nitroglycerin) .Marland Kitchen.. 1 Under Tongue As Needed For Chest Pain... 6)  Labetalol Hcl 200 Mg  Tabs (Labetalol Hcl) .Marland Kitchen.. 1 Tab By Mouth Three Times A Day 7)  Catapres 0.1 Mg  Tabs (Clonidine Hcl) .... One Tab Three Times Daily 8)  Amlodipine Besylate 10 Mg  Tabs (Amlodipine Besylate) .Marland Kitchen.. 1 Tab Daily 9)  Lisinopril 40 Mg  Tabs (Lisinopril) .Marland Kitchen.. 1 Tab Daily 10)  Hydrochlorothiazide 25 Mg  Tabs (Hydrochlorothiazide) .Marland Kitchen.. 1 Tab Daily 11)  Lovastatin 40 Mg Tabs (Lovastatin) .... Take Two  Tablets By Mouth Daily At Bedtime 12)  Fish Oil   Oil (Fish Oil) .Marland Kitchen.. 1200 Mg Three Times A Day 13)  Protonix 40 Mg  Tbec (Pantoprazole Sodium) .Marland Kitchen.. 1 By Mouth Once Daily 14)  Levsin 0.125 Mg  Tabs (Hyoscyamine Sulfate) .Marland Kitchen.. 1 Under Tongue Every 4hrs As Needed Abdominal Cramping 15)  Allopurinol 300 Mg Tabs (Allopurinol) .... Take 1 Tablet By Mouth Once A Day 16)  Acyclovir 200 Mg  Caps (Acyclovir) .... Take 3 Tabs Daily As Directed... 17)  Vicodin 5-500 Mg Tabs (Hydrocodone-Acetaminophen) .Marland Kitchen.. 1 By Mouth Every 4-6 Hr As Needed Pain  Allergies (verified): No Known Drug Allergies  Past History:  Past Medical History: Last updated: 08/17/2009  1. CAD     a. s/p stenting of LAD (2006) and LCX (2003) with drug-eluting stents     b. s/p CABG 2002         --LIMA-LAD         --RIMA-PDA         --SVG-Diag1-PL (distal limb occluded)         --SVG-Diag3 (occlided)    c. EF 50-55% echo 2/06  ALLERGIC RHINITIS (ICD-477.9) OBSTRUCTIVE SLEEP APNEA (ICD-327.23) HYPERTENSION (ICD-401.9) CORONARY ARTERY DISEASE (ICD-414.00) HYPERLIPIDEMIA (ICD-272.4) OBESITY (ICD-278.00) GERD  (ICD-530.81) DIVERTICULOSIS OF COLON (ICD-562.10) COLONIC POLYPS (ICD-211.3) RENAL INSUFFICIENCY (ICD-588.9) ORGANIC IMPOTENCE (ICD-607.84) DEGENERATIVE JOINT DISEASE (ICD-715.90) BACK PAIN, LUMBAR (ICD-724.2) HYPERURICEMIA (ICD-790.6) ANXIETY (ICD-300.00)  Past Surgical History: Last updated: 08/17/2009 S/P CABG - 2002 Cholecystectomy - S/P Lap Chole 2006 by DrHoxworth  Family History: Last updated: 01-27-08 mother deceased at age 31 due to heart disease father deceased at age 41 to heart disease brother living at 1, hx of heart disease  Social History: Last updated: 08/18/2008 Lives in [place] with his  significant other.  Tobacco; quit smoking in 2002.  Alcohol; not excessive.  No illicit drugs. quit drinking 6 years ago patient is married with 1 child  Risk Factors: Smoking Status: quit (12/12/2009)  Review of Systems      See HPI  Vital Signs:  Patient profile:   65 year old male Height:      69 inches Weight:      246 pounds BMI:     36.46 O2 Sat:      95 % on Room air Temp:     97.2 degrees F oral Pulse rate:   55 / minute BP sitting:   140 / 70  (left arm) Cuff size:   regular  Vitals Entered By: Boone Master CNA/MA (December 12, 2009 4:00 PM)  O2 Flow:  Room air CC: small red, scaly spot on right arm toward elbow x65month.  states is began as similar to a water blister but has not healed.  denies any trauma to that arm.  denies pain, but states is itching. Is Patient Diabetic? No Comments Medications reviewed with patient Daytime contact number verified with patient. Boone Master CNA/MA  December 12, 2009 4:04 PM    Physical Exam  Additional Exam:  WD, Obese, 65 y/o WM in NAD... GENERAL:  Alert & oriented; pleasant & cooperative... HEENT:  [place]/AT, EOM-wnl, PERRLA, EACs-clear, TMs-wnl, NOSE-clear, THROAT-clear & wnl. NECK:  Supple w/ fairROM; no JVD; normal carotid impulses w/o bruits; no thyromegaly or nodules palpated; no  lymphadenopathy. CHEST:  Median sternotomy scar, clear to P & A; without wheezes/ rales/ or rhonchi. HEART:  Regular Rhythm; without murmurs/ rubs/ or gallops heard. ABDOMEN:  Soft & nontender; normal bowel sounds; no organomegaly or masses detected. EXT: without deformities, mild arthritic changes; no varicose veins/ +venous insuffic,   DERM: mulitple actinic changes along arm, scalp and face. has a scaly hypopigmented lesion along right mid/proximal forearm.     Impression & Recommendations:  Problem # 1:  NEVI, MULTIPLE (ICD-216.9)  Advised on sun protection and sunscreens will refer to dermatolgy for overall mole/skin check.   Orders: Dermatology Referral (Derma) Est. Patient Level II (83151)  Medications Added to Medication List This Visit: 1)  Allegra 180 Mg Tabs (Fexofenadine hcl) .... Take 1 tablet by mouth once a day  Complete Medication List: 1)  Allegra 180 Mg Tabs (Fexofenadine hcl) .... Take 1 tablet by mouth once a day 2)  Fluticasone Propionate 50 Mcg/act Susp (Fluticasone propionate) .... 2 sp in each nostril at bedtime.Marland KitchenMarland Kitchen 3)  Bayer Low Strength 81 Mg Tbec (Aspirin) .... Take 1 tablet by mouth once a day 4)  Plavix 75 Mg Tabs (Clopidogrel bisulfate) .... Take 1 tablet by mouth once a day 5)  Nitrostat 0.4 Mg Subl (Nitroglycerin) .Marland Kitchen.. 1 under tongue as needed for chest pain.Marland KitchenMarland Kitchen 6)  Labetalol Hcl 200 Mg Tabs (Labetalol hcl) .Marland Kitchen.. 1 tab by mouth three times a day 7)  Catapres 0.1 Mg Tabs (Clonidine hcl) .... One tab three times daily 8)  Amlodipine Besylate 10 Mg Tabs (Amlodipine besylate) .Marland Kitchen.. 1 tab daily 9)  Lisinopril 40 Mg Tabs (Lisinopril) .Marland Kitchen.. 1 tab daily 10)  Hydrochlorothiazide 25 Mg Tabs (Hydrochlorothiazide) .Marland Kitchen.. 1 tab daily 11)  Lovastatin 40 Mg Tabs (Lovastatin) .... Take two  tablets by mouth daily at bedtime 12)  Fish Oil Oil (Fish oil) .Marland Kitchen.. 1200 mg three times a day 13)  Protonix 40 Mg Tbec (Pantoprazole sodium) .Marland Kitchen.. 1 by mouth once daily 14)  Levsin  0.125 Mg  Tabs (Hyoscyamine sulfate) .Marland Kitchen.. 1 under tongue every 4hrs as needed abdominal cramping 15)  Allopurinol 300 Mg Tabs (Allopurinol) .... Take 1 tablet by mouth once a day 16)  Acyclovir 200 Mg Caps (Acyclovir) .... Take 3 tabs daily as directed... 17)  Vicodin 5-500 Mg Tabs (Hydrocodone-acetaminophen) .Marland Kitchen.. 1 by mouth every 4-6 hr as needed pain  Patient Instructions: 1)  We are referring you to Dermatology to evaluate your moles and skin.  2)  follow up Dr. Kriste Basque as scheduled.

## 2010-07-18 NOTE — Progress Notes (Signed)
   Request recieved from [facility] sent to [employer]. Cala Bradford Mesiemore  Nov 10, 2009 10:11 AM

## 2010-07-18 NOTE — Assessment & Plan Note (Signed)
Summary: cpx/fasting/apc   CC:  5 month ROV & yearly CPX....  History of Present Illness: 65 y/o Green here for a 6 month follow up visit... he has multiple medical problems as noted below...    ~  2/09 f/u w/ DrBensimhon: stable- changed from Lipitor back to Lovastatin due to $$$.  ~  3/09 Ortho f/u DrBeane w/ left knee pain- ?Gout, given shot & Indocin/ Allopurinol Rx...  ~  5/09 f/u DrBensimhon w/ atypic CP & indigestion- Myoview was non-ischemic, PPI added.  ~  6/09 saw TP w/ GERD symptoms- Rx Protonix, Pepcid, Levsin... Improved...  ~  8/09 saw TP w/ bruise behind left knee= hematoma w/ VenDoppler 2 x 2.5 cm mass, now resolved.   ~  July 05, 2008:  he has had a good 5 months- the prev left post knee bruise/ hematoma resolved & he is back to baseline... he gained 11# over the holidays & is committed to getting this back off... he is due for f/u CXR & fasting labs today... we will also give him the H1N1 vaccine...   ~  March 28, 2009:  he dropped a timber on the top of his left foot- XRay neg for Fx, he had a hematoma and mild cellulitis- Rx'd Keflex, rest elevation, etc... now improved overall...  he saw DrBensimhon for a yearly check 3/10- doing satis w/ his CAD, HBP, Chol (LDL goal <70) & advised to lose weight...  he notes some nasal congestion Qhs & he's been exac the problem w/ Afrin- advised Antihist, Saline, Flonase...   ~  August 17, 2009:  his CC is arthritis in his left knee- prev eval by Rolla Plate w/ shot... he has Vicodin for Prn use & a brace for exercise program... he will f/u w/ ortho when needed...  BP controlled on meds;  no episodes of CP, etc;  Chol OK on Lovastatin, but unfortunately he hasn't gotten his weight down...    Current Problem List:  ALLERGIC RHINITIS (ICD-477.9) - advised not to use Afrin etc... rather take OTC antihist (Claritin, Zyrtek, etc), Saline, FLONASE Qhs...  OBSTRUCTIVE SLEEP APNEA (ICD-327.23) - on CPAP regularly (can't locate sleep study  results), doing well by his report.  HYPERTENSION (ICD-401.9) - well controlled on a 5 drug regimen: LABETALOL 200mg Tid,  CATAPRES 0.1mg Tid,  AMLODIPINE 10mg /d,  LISINOPRIL 40mg /d,  HCTZ 25mg /d... takes meds regularly and tol well... BP=110/68 today- denies HA, fatigue, visual changes, CP, palipit, dizziness, syncope, dyspnea, edema, etc... needs better diet, continue same meds.  ~  CXR 3/11 is clear, WNL (heart upper limit of norm).  CORONARY ARTERY DISEASE (ICD-414.00) - on above meds + ASA 81mg /d & PLAVIX 75mg /d... S/P CABG x4 9/02 by DrOwen... subseq PTCA/stent to native CIRC 6/03 (& occluded vein graft to LAD noted); last cath = 2/06 w/ PTCA/stent in LAD distal to LIMA touchdown, otherw no change... NuclearStressTest 9/07 showed neg w/ EF=62%...  ~  3/10:  yearly ROV DrBensimhon doing well, no changes made...  HYPERLIPIDEMIA (ICD-272.4) - back on LOVASTATIN 80mg /d (40mg - 2tabsQhs) due to $$$... + FISH OIL 3/d...  ~  FLP 7/08 on Lovastatin showed TChol 157, TG 127, HDL 30, LDL 101... ch to LIP40 per Cards...  ~  FLP 1/09 on Lip40 showed TChol 125, Tg 105, HDL 31, LDL 73... ch back to Lovastin80 due to $$$...  ~  FLP 7/09 on Lova80 showed TChol 142, TG 111, HDL 31, LDL 89... rec- continue same, get wt down.  ~  FLP 1/10  showed TChol 130, TG 99, HDL 30, LDL 80... rec> get wt down or stronger statin Rx!  ~  FLP 3/11 showed TChol 138, TG 96, HDL 43, LDL 76... continue same.  OBESITY (ICD-278.00)  ~  7/09:  he's done a better job w/ diet + exercise and weight down 13# in 71mo to 242#.  ~  1/10:  unfortunately weight back up 11# over the holidays to 253#... rec- diet/ exercise discussed.  ~  10/10: weight = 260# but pt notes he & wife to start diet soon!!!  ~  3/11: weight = 260#  GERD (ICD-530.81) - last EGD by DrSam 3/06 revealed severe antral gastitis.Marland Kitchen. now on PROTONIX 40mg /d, and PEPCID 20mg Qhs...  DIVERTICULOSIS OF COLON (ICD-562.10) & COLONIC POLYPS (ICD-211.3) - last colonoscopy 7/05 by  DrSam showed divertics, hems, otherw neg... f/u planned 54yrs.  RENAL INSUFFICIENCY (ICD-588.9) - he knows to avoid NSAIDs and nephrotoxic drugs...  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  DEGENERATIVE JOINT DISEASE (ICD-715.90) & HYPERURICEMIA (ICD-790.6) - as per eval DrBeane w/ ?gout- Rx'd w/ left knee shot, Indocin... back on ALLOPURINOL 300mg /d now...  ~  labs 3/09 showed Uric= 8.7...  ~  labs 7/09 showed Uric= 5.6.Marland KitchenMarland Kitchen rec- continue Allopurinol.  ~  9/10: dropped timber on left foot w/ hematoma, cellulitis- Rx'd...  ~  labs 9/10 showed Uric= 5.9 on Uloric40.  ~  labs 3/11 showed URIC= 6.3 back on Allopurinol 300mg /d.  BACK PAIN, LUMBAR (ICD-724.2)  ANXIETY (ICD-300.00)   Allergies (verified): No Known Drug Allergies  Comments:  Nurse/Medical Assistant: The patient's medications and allergies were reviewed with the patient and were updated in the Medication and Allergy Lists. Boone Master CNA  August 17, 2009 9:10 AM   Past History:  Family History: Last updated: 01-19-2008 mother deceased at age 60 due to heart disease father deceased at age 78 to heart disease brother living at 2, hx of heart disease  Social History: Last updated: 08/18/2008 Lives in [place] with his significant other.  Tobacco; quit smoking in 2002.  Alcohol; not excessive.  No illicit drugs. quit drinking 6 years ago patient is married with 1 child  Risk Factors: Smoking Status: quit (07/05/2008)  Past Medical History:  1. CAD     a. s/p stenting of LAD (2006) and LCX (2003) with drug-eluting stents     b. s/p CABG 2002         --LIMA-LAD         --RIMA-PDA         --SVG-Diag1-PL (distal limb occluded)         --SVG-Diag3 (occlided)    c. EF 50-55% echo 2/06  ALLERGIC RHINITIS (ICD-477.9) OBSTRUCTIVE SLEEP APNEA  (ICD-327.23) HYPERTENSION (ICD-401.9) CORONARY ARTERY DISEASE (ICD-414.00) HYPERLIPIDEMIA (ICD-272.4) OBESITY (ICD-278.00) GERD (ICD-530.81) DIVERTICULOSIS OF COLON (ICD-562.10) COLONIC POLYPS (ICD-211.3) RENAL INSUFFICIENCY (ICD-588.9) ORGANIC IMPOTENCE (ICD-607.84) DEGENERATIVE JOINT DISEASE (ICD-715.90) BACK PAIN, LUMBAR (ICD-724.2) HYPERURICEMIA (ICD-790.6) ANXIETY (ICD-300.00)  Past Surgical History: S/P CABG - 2002 Cholecystectomy - S/P Lap Chole 2006 by DrHoxworth  Family History: Reviewed history from 01-19-2008 and no changes required. mother deceased at age 37 due to heart disease father deceased at age 29 to heart disease brother living at 24, hx of heart disease  Social History: Reviewed history from 08/18/2008 and no changes required. Lives  in [place] with his significant other.  Tobacco; quit smoking in 2002.  Alcohol; not excessive.  No illicit drugs. quit drinking 6 years ago patient is married with 1 child  Review of Systems      See HPI       The patient complains of difficulty walking.  The patient denies anorexia, fever, weight loss, weight gain, vision loss, decreased hearing, hoarseness, chest pain, syncope, dyspnea on exertion, peripheral edema, prolonged cough, headaches, hemoptysis, abdominal pain, melena, hematochezia, severe indigestion/heartburn, hematuria, incontinence, muscle weakness, suspicious skin lesions, transient blindness, depression, unusual weight change, abnormal bleeding, enlarged lymph nodes, and angioedema.    Vital Signs:  Patient profile:   65 year old male Height:      69 inches Weight:      260 pounds BMI:     38.53 O2 Sat:      97 % on Room air Temp:     97.6 degrees F oral Pulse rate:   54 / minute BP sitting:   110 / 68  (left arm) Cuff size:   regular  Vitals Entered By: Boone Master CNA (August 17, 2009 9:10 AM)  O2 Flow:  Room air CC: 5 month ROV & yearly CPX... Is Patient Diabetic? No Comments  Medications reviewed with patient Daytime contact number verified with patient. Boone Master CNA  August 17, 2009 9:11 AM    Physical Exam  Additional Exam:  WD, Obese, 65 y/o Green in NAD... GENERAL:  Alert & oriented; pleasant & cooperative... HEENT:  [place]/AT, EOM-wnl, PERRLA, EACs-clear, TMs-wnl, NOSE-clear, THROAT-clear & wnl. NECK:  Supple w/ fairROM; no JVD; normal carotid impulses w/o bruits; no thyromegaly or nodules palpated; no lymphadenopathy. CHEST:  Median sternotomy scar, clear to P & A; without wheezes/ rales/ or rhonchi. HEART:  Regular Rhythm; without murmurs/ rubs/ or gallops heard. ABDOMEN:  Soft & nontender; normal bowel sounds; no organomegaly or masses detected. EXT: without deformities, mild arthritic changes; no varicose veins/ +venous insuffic, tr edema... NEURO:  CN's intact; motor testing normal; sensory testing normal; gait normal & balance OK. DERM:  No lesions noted; no rash etc...    CXR  Procedure date:  08/17/2009  Findings:      CHEST - 2 VIEW Comparison: 07/05/2008.   Findings: Heart size upper normal.  No acute chest findings. Sternal wire sutures.  Linear scarring right midlung zone.  Mild spondylosis of the dorsal spine.   IMPRESSION: No active chest disease.  No interval change.   Read By:  Jonne Ply,  M.D.   MISC. Report  Procedure date:  08/17/2009  Findings:      Lipid Panel (LIPID)   Cholesterol               138 mg/dL                   0-454   Triglycerides             96.0 mg/dL                  0.9-811.9   HDL                       14.78 mg/dL                 >29.56   LDL Cholesterol           76 mg/dL  0-99   BMP (METABOL)   Sodium                    143 mEq/L                   135-145   Potassium                 4.5 mEq/L                   3.5-5.1   Chloride                  102 mEq/L                   96-112   Carbon Dioxide       [H]  34 mEq/L                    19-32   Glucose                    98 mg/dL                    29-56   BUN                       17 mg/dL                    2-13   Creatinine                1.2 mg/dL                   0.8-6.5   Calcium                   9.5 mg/dL                   7.8-46.9   GFR                       64.69 mL/min                >60  Hepatic/Liver Function Panel (HEPATIC)   Total Bilirubin           0.7 mg/dL                   6.2-9.5   Direct Bilirubin          0.1 mg/dL                   2.8-4.1   Alkaline Phosphatase      88 U/L                      39-117   AST                       24 U/L                      0-37   ALT                       27 U/L                      0-53   Total Protein             7.6  g/dL                    1.6-1.0   Albumin                   4.2 g/dL                    9.6-0.4  Comments:      CBC Platelet w/Diff (CBCD)   White Cell Count          8.9 K/uL                    4.5-10.5   Red Cell Count            4.99 Mil/uL                 4.22-5.81   Hemoglobin                14.2 g/dL                   54.0-98.1   Hematocrit                44.8 %                      39.0-Henry.0   MCV                       89.9 fl                     78.0-100.0   Platelet Count            205.0 K/uL                  150.0-400.0   Neutrophil %              72.2 %                      43.0-77.0   Lymphocyte %              14.5 %                      12.0-46.0   Monocyte %                7.6 %                       3.0-12.0   Eosinophils%              4.1 %                       0.0-5.0   Basophils %               1.6 %                       0.0-3.0   TSH (TSH)   FastTSH                   2.74 uIU/mL                 0.35-5.50  Uric Acid (URIC)   Uric Acid                 6.3 mg/dL  4.0-7.8  Prostate Specific Antigen (PSA)   PSA-Hyb                   1.60 ng/mL                  0.10-4.00  UDip w/Micro (URINE)   Color                     YELLOW   Clarity                   CLEAR                        Clear   Specific Gravity          1.015                       1.000 - 1.030   Urine Ph                  7.5                         5.0-8.0   Protein                   NEGATIVE                    Negative   Urine Glucose             NEGATIVE                    Negative   Ketones                   NEGATIVE                    Negative   Urine Bilirubin           NEGATIVE                    Negative   Blood                     NEGATIVE                    Negative   Urobilinogen              0.2                         0.0 - 1.0   Leukocyte Esterace        NEGATIVE                    Negative   Nitrite                   NEGATIVE                    Negative   Urine WBC                 0-2/hpf                     0-2/hpf   Urine RBC                 3-6/hpf  0-2/hpf   Urine Epith               Rare(0-4/hpf)          Impression & Recommendations:  Problem # 1:  OBSTRUCTIVE SLEEP APNEA (ICD-327.23) Continue CPAP nightly... Orders: T-2 View CXR (71020TC)  Problem # 2:  HYPERTENSION (ICD-401.9) Controlled-  same Rx. His updated medication list for this problem includes:    Labetalol Hcl 200 Mg Tabs (Labetalol hcl) .Marland Kitchen... 1 tab by mouth three times a day    Catapres 0.1 Mg Tabs (Clonidine hcl) ..... One tab three times daily    Amlodipine Besylate 10 Mg Tabs (Amlodipine besylate) .Marland Kitchen... 1 tab daily    Lisinopril 40 Mg Tabs (Lisinopril) .Marland Kitchen... 1 tab daily    Hydrochlorothiazide 25 Mg Tabs (Hydrochlorothiazide) .Marland Kitchen... 1 tab daily  Orders: T-2 View CXR (71020TC) TLB-Lipid Panel (80061-LIPID) TLB-BMP (Basic Metabolic Panel-BMET) (80048-METABOL) TLB-Hepatic/Liver Function Pnl (80076-HEPATIC) TLB-CBC Platelet - w/Differential (85025-CBCD) TLB-TSH (Thyroid Stimulating Hormone) (84443-TSH) TLB-Uric Acid, Blood (84550-URIC) TLB-PSA (Prostate Specific Antigen) (84153-PSA) TLB-Udip w/ Micro (81001-URINE)  Problem # 3:  CORONARY ARTERY DISEASE (ICD-414.00) Stable w/o angina...  needs better diet + exercise, get wt down! His updated medication list for this problem includes:    Bayer Low Strength 81 Mg Tbec (Aspirin) .Marland Kitchen... Take 1 tablet by mouth once a day    Plavix 75 Mg Tabs (Clopidogrel bisulfate) .Marland Kitchen... Take 1 tablet by mouth once a day    Nitrostat 0.4 Mg Subl (Nitroglycerin) .Marland Kitchen... 1 under tongue as needed for chest pain...    Labetalol Hcl 200 Mg Tabs (Labetalol hcl) .Marland Kitchen... 1 tab by mouth three times a day    Catapres 0.1 Mg Tabs (Clonidine hcl) ..... One tab three times daily    Amlodipine Besylate 10 Mg Tabs (Amlodipine besylate) .Marland Kitchen... 1 tab daily    Lisinopril 40 Mg Tabs (Lisinopril) .Marland Kitchen... 1 tab daily    Hydrochlorothiazide 25 Mg Tabs (Hydrochlorothiazide) .Marland Kitchen... 1 tab daily  Problem # 4:  HYPERLIPIDEMIA (ICD-272.4) FLP stable on the Lovastatin... His updated medication list for this problem includes:    Lovastatin 40 Mg Tabs (Lovastatin) .Marland Kitchen... Take two  tablets by mouth daily at bedtime  Problem # 5:  OBESITY (ICD-278.00) Weight reduction is key!!  Problem # 6:  COLONIC POLYPS (ICD-211.3) GI is stable & up to date...  Problem # 7:  RENAL INSUFFICIENCY (ICD-588.9) Renal function improved...  Problem # 8:  DEGENERATIVE JOINT DISEASE (ICD-715.90) Hx DJD & gout... continue Allopurinol, use the Vicodin & avoid NSAIDs due to renal... His updated medication list for this problem includes:    Bayer Low Strength 81 Mg Tbec (Aspirin) .Marland Kitchen... Take 1 tablet by mouth once a day    Vicodin 5-500 Mg Tabs (Hydrocodone-acetaminophen) .Marland Kitchen... 1 by mouth every 4-6 hr as needed pain  Problem # 9:  OTHER MEDICAL PROBLEMS AS NOTED>>>  Complete Medication List: 1)  Claritin 10 Mg Tabs (Loratadine) .... Take 1 tablet by mouth once a day 2)  Fluticasone Propionate 50 Mcg/act Susp (Fluticasone propionate) .... 2 sp in each nostril at bedtime.Marland KitchenMarland Kitchen 3)  Bayer Low Strength 81 Mg Tbec (Aspirin) .... Take 1 tablet by mouth once a day 4)  Plavix 75 Mg Tabs (Clopidogrel bisulfate) ....  Take 1 tablet by mouth once a day 5)  Nitrostat 0.4 Mg Subl (Nitroglycerin) .Marland Kitchen.. 1 under tongue as needed for chest pain.Marland KitchenMarland Kitchen 6)  Labetalol Hcl 200 Mg Tabs (Labetalol hcl) .Marland Kitchen.. 1 tab by mouth three times a day 7)  Catapres  0.1 Mg Tabs (Clonidine hcl) .... One tab three times daily 8)  Amlodipine Besylate 10 Mg Tabs (Amlodipine besylate) .Marland Kitchen.. 1 tab daily 9)  Lisinopril 40 Mg Tabs (Lisinopril) .Marland Kitchen.. 1 tab daily 10)  Hydrochlorothiazide 25 Mg Tabs (Hydrochlorothiazide) .Marland Kitchen.. 1 tab daily 11)  Lovastatin 40 Mg Tabs (Lovastatin) .... Take two  tablets by mouth daily at bedtime 12)  Fish Oil Oil (Fish oil) .Marland Kitchen.. 1200 mg three times a day 13)  Protonix 40 Mg Tbec (Pantoprazole sodium) .Marland Kitchen.. 1 by mouth once daily 14)  Levsin 0.125 Mg Tabs (Hyoscyamine sulfate) .Marland Kitchen.. 1 under tongue every 4hrs as needed abdominal cramping 15)  Allopurinol 300 Mg Tabs (Allopurinol) .... Take 1 tablet by mouth once a day 16)  Acyclovir 200 Mg Caps (Acyclovir) .... Take 3 tabs daily as directed... 17)  Vicodin 5-500 Mg Tabs (Hydrocodone-acetaminophen) .Marland Kitchen.. 1 by mouth every 4-6 hr as needed pain  Other Orders: Prescription Created Electronically [phone])  Patient Instructions: 1)  Today we updated your med list- see below.... 2)  We refilled your meds per request... 3)  Today we gave you the Tetanus booster called the TDAP- it is good for 19yrs.Marland KitchenMarland Kitchen 4)  We also did your follow up CXR & FASTING blood work... please call the "phone tree" in a few days for your lab results.Marland KitchenMarland Kitchen 5)  Let's get on track w/ our diet + exercise program... the goal is to lose 15-20 lbss... 6)  Call for any problems.Marland KitchenMarland Kitchen 7)  Please schedule a follow-up appointment in 6 months. Prescriptions: VICODIN 5-500 MG TABS (HYDROCODONE-ACETAMINOPHEN) 1 by mouth every 4-6 hr as needed pain  #100 x 5   Entered and Authorized by:   Michele Mcalpine MD   Signed by:   Michele Mcalpine MD on 08/17/2009   Method used:   Print then Give to Patient   RxID:    [id number] ACYCLOVIR 200 MG  CAPS (ACYCLOVIR) take 3 tabs daily as directed...  #270 x 4   Entered and Authorized by:   Michele Mcalpine MD   Signed by:   Michele Mcalpine MD on 08/17/2009   Method used:   Print then Give to Patient   RxID:   [id number] ALLOPURINOL 300 MG TABS (ALLOPURINOL) Take 1 tablet by mouth once a day  #90 x 4   Entered and Authorized by:   Michele Mcalpine MD   Signed by:   Michele Mcalpine MD on 08/17/2009   Method used:   Print then Give to Patient   RxID:   [id number] LEVSIN 0.125 MG  TABS (HYOSCYAMINE SULFATE) 1 under tongue every 4hrs as needed abdominal cramping  #100 x prn   Entered and Authorized by:   Michele Mcalpine MD   Signed by:   Michele Mcalpine MD on 08/17/2009   Method used:   Print then Give to Patient   RxID:   [id number] PROTONIX 40 MG  TBEC (PANTOPRAZOLE SODIUM) 1 by mouth once daily  #90 x 4   Entered and Authorized by:   Michele Mcalpine MD   Signed by:   Michele Mcalpine MD on 08/17/2009   Method used:   Print then Give to Patient   RxID:   [id number] LOVASTATIN 40 MG TABS (LOVASTATIN) Take two  tablets by mouth daily at bedtime  #180 x 4   Entered and Authorized by:   Michele Mcalpine MD   Signed by:   Michele Mcalpine MD on 08/17/2009  Method used:   Print then Give to Patient   RxID:   [id number] HYDROCHLOROTHIAZIDE 25 MG  TABS (HYDROCHLOROTHIAZIDE) 1 tab daily  #90 x 4   Entered and Authorized by:   Michele Mcalpine MD   Signed by:   Michele Mcalpine MD on 08/17/2009   Method used:   Print then Give to Patient   RxID:   [id number] LISINOPRIL 40 MG  TABS (LISINOPRIL) 1 tab daily  #90 x 4   Entered and Authorized by:   Michele Mcalpine MD   Signed by:   Michele Mcalpine MD on 08/17/2009   Method used:   Print then Give to Patient   RxID:   [id number] AMLODIPINE BESYLATE 10 MG  TABS (AMLODIPINE BESYLATE) 1 tab daily  #90 x prn   Entered and Authorized by:   Michele Mcalpine MD   Signed by:   Michele Mcalpine MD on 08/17/2009    Method used:   Print then Give to Patient   RxID:   [id number] CATAPRES 0.1 MG  TABS (CLONIDINE HCL) one tab three times daily  #270 x 4   Entered and Authorized by:   Michele Mcalpine MD   Signed by:   Michele Mcalpine MD on 08/17/2009   Method used:   Print then Give to Patient   RxID:   [id number] LABETALOL HCL 200 MG  TABS (LABETALOL HCL) 1 tab by mouth three times a day  #270 x 4   Entered and Authorized by:   Michele Mcalpine MD   Signed by:   Michele Mcalpine MD on 08/17/2009   Method used:   Print then Give to Patient   RxID:   [id number] NITROSTAT 0.4 MG  SUBL (NITROGLYCERIN) 1 under tongue as needed for chest pain...  #25 x prn   Entered and Authorized by:   Michele Mcalpine MD   Signed by:   Michele Mcalpine MD on 08/17/2009   Method used:   Print then Give to Patient   RxID:   [id number] PLAVIX 75 MG  TABS (CLOPIDOGREL BISULFATE) Take 1 tablet by mouth once a day  #90 x 4   Entered and Authorized by:   Michele Mcalpine MD   Signed by:   Michele Mcalpine MD on 08/17/2009   Method used:   Print then Give to Patient   RxID:   [id number] FLUTICASONE PROPIONATE 50 MCG/ACT SUSP (FLUTICASONE PROPIONATE) 2 sp in each nostril at bedtime...  #1 x prn   Entered and Authorized by:   Michele Mcalpine MD   Signed by:   Michele Mcalpine MD on 08/17/2009   Method used:   Print then Give to Patient   RxID:   [id number]     Appended Document: TDAP documentation     Clinical Lists Changes  Orders: Added new Service order of Tdap => 39yrs IM (27035) - Signed Added new Service order of Admin 1st Vaccine (00938) - Signed Observations: Added new observation of TD BOOST VIS: 05/06/07 version given August 17, 2009. (08/18/2009 15:32) Added new observation of TD BOOSTERLO: [id number] (08/18/2009 15:32) Added new observation of TD BOOST EXP: 08/13/2011 (08/18/2009 15:32) Added new observation of TD BOOSTERBY: Boone Master CNA (08/18/2009 15:32) Added new observation of TD  BOOSTERRT: IM (08/18/2009 15:32) Added new observation of TDBOOSTERDSE: 0.5 ml (08/18/2009 15:32) Added new observation of TD BOOSTERMF: boostrix (08/18/2009 15:32) Added new observation of TD BOOST SIT: left deltoid (  08/18/2009 15:32) Added new observation of TD BOOSTER: Tdap (08/18/2009 15:32)       Immunizations Administered:  Tetanus Vaccine:    Vaccine Type: Tdap    Site: left deltoid    Mfr: boostrix    Dose: 0.5 ml    Route: IM    Given by: Boone Master CNA    Exp. Date: 08/13/2011    Lot #: [id number]    VIS given: 05/06/07 version given August 17, 2009.

## 2010-07-18 NOTE — Progress Notes (Signed)
   Recieved ROI via Fax,PT need records for self from 02-presemt sent to [facility] for processing [facility]  Nov 03, 2009 10:32 AM

## 2010-07-18 NOTE — Progress Notes (Signed)
Summary: stomach problems  Phone Note Call from Patient Call back at Home Phone [phone]   Caller: Patient Call For: nadel Reason for Call: Talk to Nurse Summary of Call: Patient c/o stomach problems; after having bm about 10 minutes later he feels like he has to go again, blood red in color occasionally, gassy, acid reflux.  Asking to speak to nurse about issues.   Initial call taken by: Lehman Prom,  May 25, 2010 10:31 AM  Follow-up for Phone Call        Pt c/ o have stomach pain after each bowel movement, also blood in his stool. acid refulx and gas.Pt staets he has seen Dr. Corinda Gubler in the past for GI issues.  He is requesting a referral for a GI appt. please advise. Carron Curie CMA  May 25, 2010 11:08 AM   Additional Follow-up for Phone Call Additional follow up Details #1::        per SN---will need ov with GI  md or PA for these problems---order has been placed in computer and i called and made the pt aware that we will call him once this appt has been made.  pt voiced his understanding Randell Loop [facility]  May 25, 2010 4:17 PM   New Problems: ABDOMINAL PAIN (ICD-789.00)   New Problems: ABDOMINAL PAIN (ICD-789.00)

## 2010-07-18 NOTE — Progress Notes (Signed)
Summary: orders  Phone Note Call from Patient Call back at [phone] ex [phone]   Caller: [employer]/  michelle Call For: nadel Summary of Call: calling about orders faxed for c pap medical supplies Initial call taken by: Rickard Patience,  August 05, 2009 2:28 PM  Follow-up for Phone Call        duplicate message. Randell Loop CMA  August 05, 2009 2:30 PM

## 2010-07-18 NOTE — Miscellaneous (Signed)
Summary: Flu Vaccine / [facility]  Flu Vaccine / [facility]   Imported By: Lennie Odor 04/10/2010 11:01:07  _____________________________________________________________________  External Attachment:    Type:   Image     Comment:   External Document

## 2010-07-18 NOTE — Progress Notes (Signed)
Summary: cpap supplies- fax coming--AWAITING FAX  Phone Note From Pharmacy   Caller: eric w/ [employer] Call For: nadel  Summary of Call: caller is re-faxing request for cpap supplies (to triage fax number).(says he faxed this request 2 days ago). eric # [phone] Initial call taken by: Tivis Ringer, CNA,  August 03, 2009 1:00 PM  Follow-up for Phone Call        called and requested fax be resent, becasue never received the first one. Rep stated they would fax it right over to triage fax. Carron Curie CMA  August 03, 2009 4:25 PM   received fax for cpap supplies--waiting for form to be filled out and will fax back at that time. Randell Loop [facility]  August 04, 2009 3:06 PM   Additional Follow-up for Phone Call Additional follow up Details #1::        cpap supply form has been faxed back. Randell Loop [facility]  August 08, 2009 9:36 AM

## 2010-07-18 NOTE — Progress Notes (Signed)
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  He also states "it may arthritis". He is not having any shortness of breath at this time, no angina. He will follow-up with Dr. Kriste Basque and GI. Mylo Red RN

## 2010-07-18 NOTE — Letter (Signed)
Summary: [facility] for CPAP Supplies/[facility]  [facility] for CPAP Supplies/[facility]   Imported By: Sherian Rein 09/19/2009 11:26:12  _____________________________________________________________________  External Attachment:    Type:   Image     Comment:   External Document

## 2010-07-18 NOTE — Progress Notes (Signed)
Summary: prescript  Phone Note Call from Patient   Caller: Patient Summary of Call: prescript for allopurinol 300mg  did not go threw Initial call taken by: Rickard Patience,  July 27, 2009 9:17 AM  Follow-up for Phone Call        Looks like allopurinol was changed to Uloric 40mg /day on 03/02/2009 by TP.  [facility] Crystal Jones RN  July 27, 2009 9:29 AM   Additional Follow-up for Phone Call Additional follow up Details #1::        Spoke with pt; Isnt using Uloric due to side effects and wants to remain on Allopurinol; Is this okay to give again? Thanks. Reynaldo Minium CMA  July 27, 2009 9:51 AM   Pt states that its okay to leave message on answering machine if no answer at home number.  [place].    Additional Follow-up for Phone Call Additional follow up Details #2::    called and spoke with pt and he is aware of allopurinol sent to pharmacy Randell Loop CMA  July 27, 2009 11:46 AM   New/Updated Medications: ALLOPURINOL 300 MG TABS (ALLOPURINOL) Take 1 tablet by mouth once a day Prescriptions: ALLOPURINOL 300 MG TABS (ALLOPURINOL) Take 1 tablet by mouth once a day  #30 x 6   Entered by:   Randell Loop CMA   Authorized by:   Michele Mcalpine MD   Signed by:   Randell Loop CMA on 07/27/2009   Method used:   Electronically to        [facility] (retail)       [address]       [place], [place]  [address]       Ph: [phone]       Fax: [phone]   RxID:   [id number]

## 2010-07-18 NOTE — Progress Notes (Signed)
Summary: talk to nurse  Phone Note Call from Patient Call back at Home Phone [phone]   Caller: Patient Call For: [name] Reason for Call: Talk to Nurse Summary of Call: Saw TP on 7/14, still experiencing shoulder pain, would like to be set up for x-ray, pls advise, states he is on vacation and to [facility]. Initial call taken by: Darletta Moll,  January 05, 2010 1:51 PM  Follow-up for Phone Call        called and lmom per pts request to cont the recs given at the ov on 7-14 by TP and that we will forward this message to TP but she will not be back in the office until 7-26. Randell Loop [facility]  January 05, 2010 4:06 PM   Additional Follow-up for Phone Call Additional follow up Details #1::        refer to ortho Additional Follow-up by: Rubye Oaks NP,  January 10, 2010 9:28 AM    Additional Follow-up for Phone Call Additional follow up Details #2::    called and spoke with pt and advised him of TP recs of setting him up with ortho---he stated that his shoulder is feeling a little better today--he has been using ice on it--he wanted to give it a little more time before he gets an appt with ortho--pt will call back if he changes his mind. Randell Loop CMA  January 11, 2010 9:16 AM

## 2010-07-19 DIAGNOSIS — Z860100 Personal history of colon polyps, unspecified: Secondary | ICD-10-CM

## 2010-07-19 HISTORY — DX: Personal history of colonic polyps: Z86.010

## 2010-07-19 HISTORY — DX: Personal history of colon polyps, unspecified: Z86.0100

## 2010-07-20 NOTE — Procedures (Signed)
Summary: Sanda Klein, MD   Colonoscopy  Procedure date:  01/04/2004  Findings:      Results: Hemorrhoids.     Results: Diverticulosis.       Location:  [facility].   Patient Name: [name], [name]. MRN:  Procedure Procedures: Colonoscopy CPT: [phone].  Personnel: Endoscopist: Ulyess Mort, MD.  Exam Location: Exam performed in Outpatient Clinic. Outpatient  Patient Consent: Procedure, Alternatives, Risks and Benefits discussed, consent obtained, from patient. Consent was obtained by the RN.  Indications  Surveillance of: Adenomatous Polyp(s).  History  Current Medications: Patient is not currently taking Coumadin.  Pre-Exam Physical: Entire physical exam was normal.  Exam Exam: Extent of exam reached: Cecum, extent intended: Cecum.  The cecum was identified by appendiceal orifice and IC valve. Colon retroflexion performed. Images were not taken. ASA Classification: II. Tolerance: good.  Monitoring: Pulse and BP monitoring, Oximetry used. Supplemental O2 given.  Colon Prep Prep results: good.  Sedation Meds: Patient assessed and found to be appropriate for moderate (conscious) sedation. Fentanyl 75 mcg. given IV. Versed 7 mg. given IV.  Findings - DIVERTICULOSIS: Splenic Flexure to Sigmoid Colon. ICD9: Diverticulosis: 562.10. Comments: mild.  - HEMORRHOIDS: External. Size: Grade II. Not bleeding. ICD9: Hemorrhoids, External: 455.3.   Assessment Abnormal examination, see findings above.  Diagnoses: 562.10: Diverticulosis.  455.3: Hemorrhoids, External.   Events  Unplanned Interventions: No intervention was required.  Unplanned Events: There were no complications. Plans Medication Plan: Continue current medications.  Patient Education: Patient given standard instructions for: Diverticulosis. Hemorrhoids. Yearly hemoccult testing recommended. Patient instructed to get routine colonoscopy every 7 years.  Disposition: After  procedure patient sent to recovery. After recovery patient sent home.   This report was created from the original endoscopy report, which was reviewed and signed by the above listed endoscopist.   cc:  Alroy Dust, MD

## 2010-07-20 NOTE — Progress Notes (Signed)
   EMSI request received sent to [facility] w/ chart [facility]  May 31, 2010 1:56 PM

## 2010-07-20 NOTE — Procedures (Signed)
Summary: Silvana Newness, MD   EGD  Procedure date:  09/07/2004  Findings:      Location: [facility]  Findings: Gastritis   Patient Name: Henry, Green. MRN:  Procedure Procedures: Panendoscopy (EGD) CPT: 43235.  Personnel: Endoscopist: Ulyess Mort, MD.  Referred By: Lonzo Cloud. Kriste Basque, MD.  Exam Location: Exam performed in Outpatient Clinic. Outpatient  Patient Consent: Procedure, Alternatives, Risks and Benefits discussed, consent obtained, from patient. Consent was obtained by the RN.  Indications Symptoms: Dysphagia. Dyspepsia, Reflux symptoms  History  Current Medications: Patient is not currently taking Coumadin.  Pre-Exam Physical: Entire physical exam was normal.  Exam Exam Info: Maximum depth of insertion Duodenum, intended Duodenum. Patient position: on left side. Vocal cords visualized. Gastric retroflexion performed. Images were not taken. ASA Classification: II. Tolerance: good.  Sedation Meds: Patient assessed and found to be appropriate for moderate (conscious) sedation. Fentanyl 50 mcg. given IV. Versed 5 mg. given IV. Cetacaine Spray given aerosolized.  Monitoring: BP and pulse monitoring done. Oximetry used. Supplemental O2 given  Findings - Normal: Proximal Esophagus to Cardia. Comments: dilated with 54 maloney.  - MUCOSAL ABNORMALITY: Fundus to Pyloric Sphincter. Erythematous mucosa. Granular mucosa. Biopsy/Mucosal Abn taken. RUT done, results pending. ICD9: Gastritis, Chronic: 535.10. Comment: mod. severe antral gastritis.  - MUCOSAL ABNORMALITY: Duodenal Bulb to Duodenal 2nd Portion. Granular mucosa.   Assessment Abnormal examination, see findings above.  Diagnoses: 535.10: Gastritis, Chronic.   Events  Unplanned Intervention: No unplanned interventions were required.  Unplanned Events: There were no complications. Plans Medication(s): Await pathology. Continue current medications. PPI: Esomeprazole/Nexium    Patient Education: Patient given standard instructions for: Mucosal Abnormality.  Disposition: After procedure patient sent to recovery. After recovery patient sent home.  Scheduling: Call office for appointment, to Ulyess Mort, MD, needs rov to determine response to rx. and results of bx,s    This report was created from the original endoscopy report, which was reviewed and signed by the above listed endoscopist.   cc:  Alroy Dust, MD

## 2010-07-20 NOTE — Op Note (Signed)
Summary: Operative Report   NAME:  TOREZ, BEAUREGARD                  ACCOUNT NO.:  [id number]   MEDICAL RECORD NO.:  [id number]          PATIENT TYPE:  AMB   LOCATION:  SDS                          FACILITY:  [facility]   PHYSICIAN:  Sharlet Salina T. Hoxworth, M.D.DATE OF BIRTH:  Dec 23, 1945   DATE OF PROCEDURE:  03/21/2006  DATE OF DISCHARGE:  03/21/2006                                 OPERATIVE REPORT   PREOPERATIVE DIAGNOSES:  1. Cholelithiasis and cholecystitis.  2. Lipoma right arm.   POSTOPERATIVE DIAGNOSES:  1. Cholelithiasis and cholecystitis.  2. Lipoma right arm.   SURGICAL PROCEDURES:  1. Laparoscopic cholecystectomy with intraoperative cholangiogram.  2. Excision 3-cm lipoma right arm.   SURGEON:  Lorne Skeens. Hoxworth, M.D.   ANESTHESIA:  General.   BRIEF HISTORY:  [name] is a 65 year old male who presents with gradually  worsening episodic right upper quadrant discomfort.  Workup has included a  gallbladder ultrasound indicating stones and thickening of the gallbladder  wall.  He is felt to have symptomatic cholelithiasis and cholecystitis.  Laparoscopic cholecystectomy with cholangiogram has been recommended and  accepted.  In addition, he has an enlarging uncomfortable 3-cm subcutaneous  mass just above the right antecubital fossa consistent with lipoma.  We have  recommended proceeding with laparoscopic cholecystectomy with cholangiogram  and excision of his symptomatic subcutaneous mass.  The nature of the  procedures, indications, risks of bleeding, infection, bile leak, bile duct  injury and cardiorespiratory complications have been discussed and  understood.  He is now brought to the operating room for this procedure.   DESCRIPTION OF PROCEDURE:  The patient was brought to the operating room,  placed in supine position on the operating table and general endotracheal  anesthesia was induced.  The abdomen was widely sterilely prepped and  draped.  Correct patient and  procedure were verified.  He received  preoperative antibiotics.  Local anesthesia was used to infiltrate the  trocar sites prior to the incision.  A 1-cm incision made above the  umbilicus and dissection carried down to midline fascia which was sharply  incised for 1 cm.  The peritoneum was entered under direct vision.  Through  a mattress suture of 0 Vicryl, the Hasson trocar was placed and  pneumoperitoneum established.  Under direct vision a 10-mm trocar was placed  in the subxiphoid area and two 5-mm trocars along the right subcostal  margin.  The gallbladder was visualized.  It was mildly thickened and there  were some omental adhesions.  These were carefully taken down with cautery  dissection and the fundus grasped and elevated up over the liver.  The  infundibulum was exposed with further dissection and retracted  inferolaterally.  Peritoneum anterior and posterior to Calot's triangle was  incised.  Fibrofatty tissue was stripped off the neck of the gallbladder  toward the porta hepatis.  The distal gallbladder was thoroughly dissected  and the cystic duct identified and the cystic duct/gallbladder junction  dissected 360 degrees.  When the anatomy appeared clear, the cystic duct was  clipped at the gallbladder junction and operative  cholangiograms were  obtained through the cystic duct.  This showed good filling of normal common  bile duct and intrahepatic ducts with free flow into the duodenum and no  filling defects.  Following this, the cholangiocatheter was removed, and the  cystic duct was doubly clipped proximally divided.  The cystic artery was  clearly defined in Calot's triangle and was doubly clipped proximally,  clipped distally and divided.  The gallbladder was then dissected free from  its bed using hook cautery.  It was removed intact through the umbilicus.  Complete hemostasis in the gallbladder bed with cautery and an additional  Surgicel pack was placed for added  hemostasis as the patient was currently  on Plavix.  Trocars were removed under direct vision and all CO2 evacuated.  The mattress suture was secured at the umbilicus and an additional 0 Vicryl  endo-tie was placed.  Skin was closed with subcuticular 4-0 Monocryl and  Steri-Strips.   Following this, the right arm was sterilely prepped and draped.  A  transverse incision was made directly over the mass and dissection carried  down through the subcutaneous tissue.  A typical-appearing encapsulated  lipoma was bluntly dissected free from the deep tissues and excised  completely.  The wound was closed with subcuticular 4-0 Monocryl and Steri-  Strips.  Sponge, needle and instrument counts were correct.  The patient was  taken to the recovery room in good condition.      Lorne Skeens. Hoxworth, M.D.  Electronically Signed     BTH/MEDQ  D:  03/21/2006  T:  03/22/2006  Job:  [id number]

## 2010-07-20 NOTE — Letter (Signed)
Summary: Pre Visit Letter Revised  [facility]  [address] [place], [place] [address]   Phone: [phone]  Fax: [phone]        06/29/2010 MRN: [id number] [name] [address] [place], [place]  [address]             Procedure Date:  08/11/10   Welcome to the Gastroenterology Division at [facility].    You are scheduled to see a nurse for your pre-procedure visit on 08/04/10 at 8:30 am on the 3rd floor at [facility], [address]  We ask that you try to arrive at our office 15 minutes prior to your appointment time to allow for check-in.  Please take a minute to review the attached form.  If you answer "Yes" to one or more of the questions on the first page, we ask that you call the person listed at your earliest opportunity.  If you answer "No" to all of the questions, please complete the rest of the form and bring it to your appointment.    Your nurse visit will consist of discussing your medical and surgical history, your immediate family medical history, and your medications.   If you are unable to list all of your medications on the form, please bring the medication bottles to your appointment and we will list them.  We will need to be aware of both prescribed and over the counter drugs.  We will need to know exact dosage information as well.    Please be prepared to read and sign documents such as consent forms, a financial agreement, and acknowledgement forms.  If necessary, and with your consent, a friend or relative is welcome to sit-in on the nurse visit with you.  Please bring your insurance card so that we may make a copy of it.  If your insurance requires a referral to see a specialist, please bring your referral form from your primary care physician.  No co-pay is required for this nurse visit.     If you cannot keep your appointment, please call [phone] to cancel or reschedule prior to your appointment date.  This allows [place]  the opportunity to schedule an appointment for another patient in need of care.    Thank you for choosing  Gastroenterology for your medical needs.  We appreciate the opportunity to care for you.  Please visit [place] at our website  to learn more about our practice.  Sincerely, The Gastroenterology Division

## 2010-07-20 NOTE — Progress Notes (Signed)
Summary[facility]   Imported By: Sherian Rein 06/23/2010 07:21:59  _____________________________________________________________________  External Attachment:    Type:   Image     Comment:   External Document

## 2010-07-20 NOTE — Progress Notes (Signed)
Summary: [facility]   Imported By: Sherian Rein 06/23/2010 07:20:54  _____________________________________________________________________  External Attachment:    Type:   Image     Comment:   External Document

## 2010-07-20 NOTE — Assessment & Plan Note (Signed)
Summary: ROV      Allergies Added: NKDA  Visit Type:  Follow-up Referring Provider:  Alroy Dust, MD Primary Provider:  Alroy Dust, MD  CC:  shortness of breath / pain in arm.  History of Present Illness: [name] is a very pleasant 65 year old male with a history of CAD s/p CABG 2002 and stenting of LAD (2006) and LCX (2003) with drug-eluting stents  Had ETT 4/11 which was normal.      Returns at request of Dr. Marina Goodell for pre-op EGD/colonscopy. clearance. Having a problem with abdominal bloating and GERD. Also notes that he can occasionally get numbness and tingling in L arm. No frank CP. Not walking regularly due to gout/OA. Notes he does get mild SOB when walking particularly in the am but gets better in the afternoon. Thinks it is related to his medication and his weight.      Current Medications (verified): 1)  Fluticasone Propionate 50 Mcg/act Susp (Fluticasone Propionate) .... 2 Sp in Each Nostril At Bedtime.Marland KitchenMarland Kitchen 2)  Bayer Low Strength 81 Mg  Tbec (Aspirin) .... Take 1 Tablet By Mouth Once A Day 3)  Plavix 75 Mg  Tabs (Clopidogrel Bisulfate) .... Take 1 Tablet By Mouth Once A Day 4)  Nitrostat 0.4 Mg  Subl (Nitroglycerin) .Marland Kitchen.. 1 Under Tongue As Needed For Chest Pain... 5)  Labetalol Hcl 200 Mg  Tabs (Labetalol Hcl) .Marland Kitchen.. 1 Tab By Mouth Three Times A Day 6)  Catapres 0.1 Mg  Tabs (Clonidine Hcl) .... One Tab Three Times Daily 7)  Amlodipine Besylate 10 Mg  Tabs (Amlodipine Besylate) .Marland Kitchen.. 1 Tab Daily 8)  Lisinopril 40 Mg  Tabs (Lisinopril) .Marland Kitchen.. 1 Tab Daily 9)  Hydrochlorothiazide 25 Mg  Tabs (Hydrochlorothiazide) .Marland Kitchen.. 1 Tab Daily 10)  Lovastatin 40 Mg Tabs (Lovastatin) .... Take Two  Tablets By Mouth Daily At Bedtime 11)  Fish Oil   Oil (Fish Oil) .Marland Kitchen.. 1200 Mg Three Times A Day 12)  Protonix 40 Mg  Tbec (Pantoprazole Sodium) .Marland Kitchen.. 1 By Mouth Once Daily 13)  Levsin 0.125 Mg  Tabs (Hyoscyamine Sulfate) .Marland Kitchen.. 1 Under Tongue Every 4hrs As Needed Abdominal Cramping 14)  Allopurinol 300 Mg  Tabs (Allopurinol) .... Take 1 Tablet By Mouth Once A Day 15)  Vicodin 5-500 Mg Tabs (Hydrocodone-Acetaminophen) .Marland Kitchen.. 1 By Mouth Every 4-6 Hr As Needed Pain 16)  Acyclovir 200 Mg  Caps (Acyclovir) .... Take 3 Tabs Daily As Directed...  Allergies (verified): No Known Drug Allergies  Past History:  Past Medical History:  1. CAD     a. s/p stenting of LAD (2006) and LCX (2003) with drug-eluting stents     b. s/p CABG 2002         --LIMA-LAD         --RIMA-PDA         --SVG-Diag1-PL (distal limb occluded)         --SVG-Diag3 (occluded)    c. EF 50-55% echo 2/06 ALLERGIC RHINITIS (ICD-477.9) OBSTRUCTIVE SLEEP APNEA (ICD-327.23) HYPERTENSION (ICD-401.9) CORONARY ARTERY DISEASE (ICD-414.00) HYPERLIPIDEMIA (ICD-272.4) OBESITY (ICD-278.00) GERD (ICD-530.81) DIVERTICULOSIS OF COLON (ICD-562.10) COLONIC POLYPS (ICD-211.3) RENAL INSUFFICIENCY (ICD-588.9) ORGANIC IMPOTENCE (ICD-607.84) DEGENERATIVE JOINT DISEASE (ICD-715.90) BACK PAIN, LUMBAR (ICD-724.2) HYPERURICEMIA (ICD-790.6) ANXIETY (ICD-300.00) Hemorrhoids Chronic Gastritis Herpes Gout  Review of Systems       As per HPI and past medical history; otherwise all systems negative.   Vital Signs:  Patient profile:   65 year old male Height:      69 inches Weight:  255 pounds BMI:     37.79 Pulse rate:   53 / minute BP sitting:   120 / 76  (left arm) Cuff size:   large  Vitals Entered By: Hardin Negus, RMA (June 29, 2010 9:26 AM)  Physical Exam  General:  Well developed, obese,well nourished, no acute distress. HEENT: normal Neck: supple. no JVD. Carotids 2+ bilat; no bruits. No lymphadenopathy or thryomegaly appreciated. Cor: PMI nondisplaced. Regular rate & rhythm. No rubs, gallops, murmur. Lungs: clear Abdomen: obese soft, nontender, nondistended. No bruits or masses. Good bowel sounds. Extremities: no cyanosis, clubbing, rash, edema Neuro: alert & orientedx3, cranial nerves grossly intact. moves all 4  extremities w/o difficulty. affect pleasant    Impression & Recommendations:  Problem # 1:  CORONARY ARTERY DISEASE (ICD-414.00) Doing well from a cardiac perspective. I do not think his symptoms are cardiac in nature particularly given results of recent stress test. I have cleared him to proceed with EGD and colonoscopy without further cardiac work-up. Can hold Plavix as needed for procedure. If symptoms do not abate can consider Myoview down the road as needed.   Problem # 2:  PRE-OPERATIVE CARDIAC EXAM (ICD-V72.81) As above.   Other Orders: EKG w/ Interpretation (93000)  Patient Instructions: 1)  Your physician wants you to follow-up in:  4 months.  You will receive a reminder letter in the mail two months in advance. If you don't receive a letter, please call our office to schedule the follow-up appointment.

## 2010-07-20 NOTE — Assessment & Plan Note (Signed)
Summary: ABD PAIN & BLOOD IN STOOLS, GERD    History of Present Illness Visit Type: Initial Consult Primary GI MD: Yancey Flemings MD Primary Provider: Alroy Dust, MD Requesting Provider: Alroy Dust, MD Chief Complaint: Patient c/o several weeks intermittent generalized abdominal discomfort as well as bright red bleeding with bowel movements (although there is no diarrhea or constipation). Patient also c/o reflux as well as some intermittent dysphagia to solid foods.     History of Present Illness:   65 year old white male with multiple significant medical problems including coronary artery disease for which she has undergone prior CABG with subsequent coronary artery drug-eluting stent placement, hypertension, obesity, obstructive sleep apnea, hyperlipidemia, GERD, chronic back pain, anxiety, and a history of colon polyps. The patient presents today with multiple GI issues. Previous patient of Dr. Victorino Dike. First visit with me today. First, he reports worsening GERD as manifested by regurgitation. Some nausea but no vomiting. Intermittent solid food dysphagia. His last colonoscopy was in 2005. This revealed diverticulosis and hemorrhoids. No polyps. His last upper endoscopy was performed 2006. Esophageal dilation with 54 [place] Maloney at that time.. Also left-sided chest discomfort that he attributes to GERD. Particularly in the morning. Nonexertional. Next, he describes a change in bowel habits manifested by a normal bowel movement the morning followed by more urgent bowel movement thereafter. He has seen some blood intermittently. Described as red. It is also been similar, cramping that improves with defecation. No weight loss. In addition to problems with chest discomfort, he mentions discomfort/numbness/tingling in the left arm intermittently. This has gone on for several months. For GERD, he takes pantoprazole daily. For his heart, he is on Plavix in addition multiple other medications as  listed..hemoglobin in March 2011 was 14.   GI Review of Systems    Reports abdominal pain, acid reflux, belching, bloating, chest pain, dysphagia with solids, and  heartburn.     Location of  Abdominal pain: generalized.    Denies dysphagia with liquids, loss of appetite, nausea, vomiting, vomiting blood, weight loss, and  weight gain.      Reports rectal bleeding and  rectal pain.     Denies anal fissure, black tarry stools, change in bowel habit, constipation, diarrhea, diverticulosis, fecal incontinence, heme positive stool, hemorrhoids, irritable bowel syndrome, jaundice, light color stool, and  liver problems. Preventive Screening-Counseling & Management  Caffeine-Diet-Exercise     Does Patient Exercise: no    Current Medications (verified): 1)  Fluticasone Propionate 50 Mcg/act Susp (Fluticasone Propionate) .... 2 Sp in Each Nostril At Bedtime.Marland KitchenMarland Kitchen 2)  Bayer Low Strength 81 Mg  Tbec (Aspirin) .... Take 1 Tablet By Mouth Once A Day 3)  Plavix 75 Mg  Tabs (Clopidogrel Bisulfate) .... Take 1 Tablet By Mouth Once A Day 4)  Nitrostat 0.4 Mg  Subl (Nitroglycerin) .Marland Kitchen.. 1 Under Tongue As Needed For Chest Pain... 5)  Labetalol Hcl 200 Mg  Tabs (Labetalol Hcl) .Marland Kitchen.. 1 Tab By Mouth Three Times A Day 6)  Catapres 0.1 Mg  Tabs (Clonidine Hcl) .... One Tab Three Times Daily 7)  Amlodipine Besylate 10 Mg  Tabs (Amlodipine Besylate) .Marland Kitchen.. 1 Tab Daily 8)  Lisinopril 40 Mg  Tabs (Lisinopril) .Marland Kitchen.. 1 Tab Daily 9)  Hydrochlorothiazide 25 Mg  Tabs (Hydrochlorothiazide) .Marland Kitchen.. 1 Tab Daily 10)  Lovastatin 40 Mg Tabs (Lovastatin) .... Take Two  Tablets By Mouth Daily At Bedtime 11)  Fish Oil   Oil (Fish Oil) .Marland Kitchen.. 1200 Mg Three Times A Day 12)  Protonix 40  Mg  Tbec (Pantoprazole Sodium) .Marland Kitchen.. 1 By Mouth Once Daily 13)  Levsin 0.125 Mg  Tabs (Hyoscyamine Sulfate) .Marland Kitchen.. 1 Under Tongue Every 4hrs As Needed Abdominal Cramping 14)  Allopurinol 300 Mg Tabs (Allopurinol) .... Take 1 Tablet By Mouth Once A Day 15)   Vicodin 5-500 Mg Tabs (Hydrocodone-Acetaminophen) .Marland Kitchen.. 1 By Mouth Every 4-6 Hr As Needed Pain 16)  Acyclovir 200 Mg  Caps (Acyclovir) .... Take 3 Tabs Daily As Directed...  Allergies (verified): No Known Drug Allergies  Past History:  Past Medical History:  1. CAD     a. s/p stenting of LAD (2006) and LCX (2003) with drug-eluting stents     b. s/p CABG 2002         --LIMA-LAD         --RIMA-PDA         --SVG-Diag1-PL (distal limb occluded)         --SVG-Diag3 (occlided)    c. EF 50-55% echo 2/06 ALLERGIC RHINITIS (ICD-477.9) OBSTRUCTIVE SLEEP APNEA (ICD-327.23) HYPERTENSION (ICD-401.9) CORONARY ARTERY DISEASE (ICD-414.00) HYPERLIPIDEMIA (ICD-272.4) OBESITY (ICD-278.00) GERD (ICD-530.81) DIVERTICULOSIS OF COLON (ICD-562.10) COLONIC POLYPS (ICD-211.3) RENAL INSUFFICIENCY (ICD-588.9) ORGANIC IMPOTENCE (ICD-607.84) DEGENERATIVE JOINT DISEASE (ICD-715.90) BACK PAIN, LUMBAR (ICD-724.2) HYPERURICEMIA (ICD-790.6) ANXIETY (ICD-300.00) Hemorrhoids Chronic Gastritis Herpes Gout  Past Surgical History: S/P CABG - 2002 Angioplasty Cholecystectomy - S/P Lap Chole 2006 by DrHoxworth  Family History: mother deceased at age 40 due to heart disease father deceased at age 25 to heart disease brother living at 75, hx of heart disease No FH of Colon Cancer: Family History of Diabetes: Mother, Brother  Social History: Lives in [place] with his significant other.  Tobacco; quit smoking in 2002.   Alcohol; not excessive.  -2 drinks per day No illicit drugs. patient is married with 1 child Patient does not get regular exercise.  Does Patient Exercise:  no  Review of Systems       The patient complains of allergy/sinus, anxiety-new, arthritis/joint pain, blood in urine, fatigue, hearing problems, itching, muscle pains/cramps, night sweats, nosebleeds, shortness of breath, skin rash, swelling of feet/legs, and thirst - excessive.  The patient denies anemia, back pain, breast  changes/lumps, change in vision, confusion, cough, coughing up blood, depression-new, fainting, fever, headaches-new, heart murmur, heart rhythm changes, menstrual pain, pregnancy symptoms, sleeping problems, sore throat, swollen lymph glands, thirst - excessive , urination - excessive , urination changes/pain, urine leakage, vision changes, and voice change.    Vital Signs:  Patient profile:   65 year old male Height:      69 inches Weight:      260.50 pounds BMI:     38.61 BSA:     2.31 Pulse rate:   60 / minute Pulse rhythm:   regular BP sitting:   118 / 84  (left arm)  Vitals Entered By: Lamona Curl CMA (AAMA) (June 21, 2010 9:18 AM)   Physical Exam  General:  Well developed, obese, chronically ill-appearing,well nourished, no acute distress. Head:  Normocephalic and atraumatic. Eyes:  PERRLA, no icterus. Nose:  No deformity, discharge,  or lesions. Mouth:  No deformity or lesions. Neck:  Supple but thick; no masses or thyromegaly. Lungs:  Clear throughout to auscultation. Heart:  Regular rate and rhythm; no murmurs, rubs,  or bruits. Abdomen:  Soft,obese, nontender and nondistended. No masses, hepatosplenomegaly or hernias noted. Normal bowel sounds. Rectal:  deferred until colonoscopy Msk:  Symmetrical with no gross deformities. Normal posture. Pulses:  Normal pulses noted. Extremities:  No  clubbing, cyanosis,  or deformities noted. Trace edema bilaterally in the ankles. Neurologic:  Alert and  oriented x4;  grossly normal neurologically. Skin:  Intact without significant lesions or rashes. Cervical Nodes:  No significant cervical or supraclavicular adenopathy. Psych:  Alert and cooperative. Normal mood and affect.   Impression & Recommendations:  Problem # 1:  RECTAL BLEEDING (ICD-569.3) intermittent rectal bleeding. Low volume. Likely hemorrhoids based on description.  Plan: #1. Colonoscopy. The nature of the procedure as well as the risks, benefits, and  alternatives were reviewed. He understood and agreed to proceed. He is high risk given his comorbidities. He will be prescribed Movi prep. I would like to do the case with propofol/CRNA assistance. He will need cardiology clearance (see below) as well as a cardiology opinion regarding the feasibility of holding his Plavix. We have arranged for him to see his cardiologist, Dr. Arvilla Meres next week.  Problem # 2:  ABDOMINAL PAIN, GENERALIZED (ICD-789.07) intermittent cramping abdominal discomfort relieved with defecation. Given bleeding, rule out low-level colitis. Will evaluate with colonoscopy.  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  Plan: #1. Upper endoscopy with esophageal dilation. The nature of the procedure as well as the risks, benefits, and alternatives were reviewed. He understood and agreed to proceed. Patient is high risk given his comorbidities. He will need cardiology clearance because of his complaints of chest pain. As well, this needs to be done off Plavix if at all possible.  Problem # 4:  GERD (ICD-530.81) certainly has GERD. Certainly some of his symptoms seem quite related, such as regurgitation and dysphagia. Not clear that his chest discomfort is related.  Problem # 5:  COLONIC POLYPS (ICD-211.3) history of polyps. Type uncertain. No pathology in this volume of his chart.  Problem # 6:  CHEST PAIN (ICD-786.50) chest pain. Somewhat atypical. Patient is a fair historian. Significant coronary artery disease. I would like him to see his cardiologist, Dr. Arvilla Meres for procedural clearance as well as addressing the issue with holding Plavix 5 days prior to his examinations. He has a scheduled appointment with Dr. Gala Romney June 27, 2010. I will forward this note to him.  Other Orders: Cardiology Referral (Cardiology)  Patient Instructions: 1)  Cardiology Appt. with Dr. Gala Romney  06/27/10 10:00 am arrive at 9:45 am. 2)  We will schedule your Colon/Endo once cleared from your cardiologist. 3)  We will call you to set up your procedure time and pre-visit appt. 4)  Copy sent to :  Alroy Dust, MD  Nicholes Mango, MD 5)  The medication list was reviewed and reconciled.  All changed / newly prescribed medications were explained.  A complete medication list was provided to the patient / caregiver.  Appended Document: ABD PAIN & BLOOD IN STOOLS, GERD Barb, the patient has recieved cardiology clearance.  He needs to come in for previsit for COLON (Movi prep) AND EGD.  HIS PLAVIX WILL NEED TO BE HELD 5 DAYS PRIOR TO EXAMS (already approved by cardiology), AND FINALLY, THIS WILL NEED TO BE DONE IN THE [facility] on a PROPOFOL DAY. Thanks  Appended Document: ABD PAIN & BLOOD IN STOOLS, GERD Colon/Endo scheduled for 08/11/10 at 2:30 pm and pre-visit appt. 08/04/10 8:30 am.  Sent letter to patient with pre-visit date and time along with procedure date and time.

## 2010-07-20 NOTE — Progress Notes (Signed)
 ----   Converted from flag ---- ---- 06/30/2010 10:12 AM, Hilarie Fredrickson MD wrote: for propofol use obesity and obstructive sleep apnea;  for procedures, all codes for the problems outlined at end of note  ---- 06/29/2010 1:16 PM, Milford Cage NCMA wrote: Dr. Lenard Galloway diagnosis do I use for doing his Endo/Colon with Propoful??? ------------------------------

## 2010-08-01 ENCOUNTER — Telehealth: Payer: Self-pay | Admitting: Pulmonary Disease

## 2010-08-03 ENCOUNTER — Encounter (INDEPENDENT_AMBULATORY_CARE_PROVIDER_SITE_OTHER): Payer: Self-pay | Admitting: *Deleted

## 2010-08-04 ENCOUNTER — Encounter: Payer: Self-pay | Admitting: Internal Medicine

## 2010-08-08 ENCOUNTER — Encounter: Payer: Self-pay | Admitting: Pulmonary Disease

## 2010-08-09 ENCOUNTER — Telehealth: Payer: Self-pay | Admitting: Pulmonary Disease

## 2010-08-09 NOTE — Miscellaneous (Signed)
Summary: [facility]/ [facility]  Clinical Lists Changes  Medications: Added new medication of MOVIPREP 100 GM  SOLR (PEG-KCL-NACL-NASULF-NA ASC-C) As per prep instructions. - Signed Rx of MOVIPREP 100 GM  SOLR (PEG-KCL-NACL-NASULF-NA ASC-C) As per prep instructions.;  #1 x 0;  Signed;  Entered by: Durwin Glaze RN;  Authorized by: Hilarie Fredrickson MD;  Method used: Electronically to [facility]*, [address] [place], [place]  [address], Ph: [phone] or [phone], Fax: [phone] Observations: Added new observation of NKA: T (08/04/2010 8:10)    Prescriptions: MOVIPREP 100 GM  SOLR (PEG-KCL-NACL-NASULF-NA ASC-C) As per prep instructions.  #1 x 0   Entered by:   Durwin Glaze RN   Authorized by:   Hilarie Fredrickson MD   Signed by:   Durwin Glaze RN on 08/04/2010   Method used:   Electronically to        [facility]* (retail)       [address] [place], [place]  [address]       Ph: [phone] or [phone]       Fax: [phone]   RxID:   [id number]

## 2010-08-09 NOTE — Letter (Signed)
Summary: Moviprep Instructions  [facility]  [address]   [place], [place] [address]   Phone: [phone]  Fax: [phone]       [name]    [birth year]    MRN: [id number]        Procedure Day /Date: Friday, 08-18-10     Arrival Time: 1:30 p.m.     Procedure Time: 2:30 p.m.     Location of Procedure:                     x [facility] (4th Floor)                        PREPARATION FOR COLONOSCOPY WITH MOVIPREP   Starting 5 days prior to your procedure 08-13-10 do not eat nuts, seeds, popcorn, corn, beans, peas,  salads, or any raw vegetables.  Do not take any fiber supplements (e.g. Metamucil, Citrucel, and Benefiber).  THE DAY BEFORE YOUR PROCEDURE         DATE: 08-17-10  DAY: Thursday  1.  Drink clear liquids the entire day-NO SOLID FOOD  2.  Do not drink anything colored red or purple.  Avoid juices with pulp.  No orange juice.  3.  Drink at least 64 oz. (8 glasses) of fluid/clear liquids during the day to prevent dehydration and help the prep work efficiently.  CLEAR LIQUIDS INCLUDE: Water Jello Ice Popsicles Tea (sugar ok, no milk/cream) Powdered fruit flavored drinks Coffee (sugar ok, no milk/cream) Gatorade Juice: apple, white grape, white cranberry  Lemonade Clear bullion, consomm, broth Carbonated beverages (any kind) Strained chicken noodle soup Hard Candy                             4.  In the morning, mix first dose of MoviPrep solution:    Empty 1 Pouch A and 1 Pouch B into the disposable container    Add lukewarm drinking water to the top line of the container. Mix to dissolve    Refrigerate (mixed solution should be used within 24 hrs)  5.  Begin drinking the prep at 5:00 p.m. The MoviPrep container is divided by 4 marks.   Every 15 minutes drink the solution down to the next mark (approximately 8 oz) until the full liter is complete.   6.  Follow completed prep with 16 oz of clear liquid of your choice (Nothing  red or purple).  Continue to drink clear liquids until bedtime.  7.  Before going to bed, mix second dose of MoviPrep solution:    Empty 1 Pouch A and 1 Pouch B into the disposable container    Add lukewarm drinking water to the top line of the container. Mix to dissolve    Refrigerate  THE DAY OF YOUR PROCEDURE      DATE: 08-18-10 DAY: Friday  Beginning at 9:30 a.m. (5 hours before procedure):         1. Every 15 minutes, drink the solution down to the next mark (approx 8 oz) until the full liter is complete.  2. Follow completed prep with 16 oz. of clear liquid of your choice.    3. You may drink clear liquids until 12:30 p.m. (2 HOURS BEFORE PROCEDURE).   MEDICATION INSTRUCTIONS  Unless otherwise instructed, you should take regular prescription medications with a small sip of water   as early as possible the morning of your  procedure.   Stop taking Plavix or Aggrenox on 08-13-10  (5 days before procedure).     Additional medication instructions: DO NOT TAKE HYDROCHLOROTHIAZIDE ON DAY OF PROCEDURE         OTHER INSTRUCTIONS  You will need a responsible adult at least 66 years of age to accompany you and drive you home.   This person must remain in the waiting room during your procedure.  Wear loose fitting clothing that is easily removed.  Leave jewelry and other valuables at home.  However, you may wish to bring a book to read or  an iPod/MP3 player to listen to music as you wait for your procedure to start.  Remove all body piercing jewelry and leave at home.  Total time from sign-in until discharge is approximately 2-3 hours.  You should go home directly after your procedure and rest.  You can resume normal activities the  day after your procedure.  The day of your procedure you should not:   Drive   Make legal decisions   Operate machinery   Drink alcohol   Return to work  You will receive specific instructions about eating, activities and  medications before you leave.    The above instructions have been reviewed and explained to me by   Durwin Glaze RN  August 04, 2010 8:56 AM     I fully understand and can verbalize these instructions _____________________________ Date _________

## 2010-08-10 ENCOUNTER — Telehealth: Payer: Self-pay | Admitting: Pulmonary Disease

## 2010-08-11 ENCOUNTER — Other Ambulatory Visit: Payer: Self-pay | Admitting: Internal Medicine

## 2010-08-11 ENCOUNTER — Encounter: Payer: Self-pay | Admitting: Internal Medicine

## 2010-08-11 ENCOUNTER — Encounter (AMBULATORY_SURGERY_CENTER): Payer: Medicare Other | Admitting: Internal Medicine

## 2010-08-11 DIAGNOSIS — K222 Esophageal obstruction: Secondary | ICD-10-CM

## 2010-08-11 DIAGNOSIS — R1310 Dysphagia, unspecified: Secondary | ICD-10-CM

## 2010-08-11 DIAGNOSIS — D126 Benign neoplasm of colon, unspecified: Secondary | ICD-10-CM

## 2010-08-11 DIAGNOSIS — R109 Unspecified abdominal pain: Secondary | ICD-10-CM

## 2010-08-11 DIAGNOSIS — K625 Hemorrhage of anus and rectum: Secondary | ICD-10-CM

## 2010-08-14 ENCOUNTER — Encounter: Payer: Self-pay | Admitting: Pulmonary Disease

## 2010-08-14 ENCOUNTER — Encounter (INDEPENDENT_AMBULATORY_CARE_PROVIDER_SITE_OTHER): Payer: Self-pay | Admitting: *Deleted

## 2010-08-14 ENCOUNTER — Other Ambulatory Visit: Payer: Self-pay | Admitting: Pulmonary Disease

## 2010-08-14 ENCOUNTER — Other Ambulatory Visit: Payer: Medicare Other

## 2010-08-14 ENCOUNTER — Ambulatory Visit (INDEPENDENT_AMBULATORY_CARE_PROVIDER_SITE_OTHER): Payer: Medicare Other | Admitting: Pulmonary Disease

## 2010-08-14 DIAGNOSIS — K219 Gastro-esophageal reflux disease without esophagitis: Secondary | ICD-10-CM

## 2010-08-14 DIAGNOSIS — G4733 Obstructive sleep apnea (adult) (pediatric): Secondary | ICD-10-CM

## 2010-08-14 DIAGNOSIS — J309 Allergic rhinitis, unspecified: Secondary | ICD-10-CM

## 2010-08-14 DIAGNOSIS — K5730 Diverticulosis of large intestine without perforation or abscess without bleeding: Secondary | ICD-10-CM

## 2010-08-14 DIAGNOSIS — E669 Obesity, unspecified: Secondary | ICD-10-CM

## 2010-08-14 DIAGNOSIS — R748 Abnormal levels of other serum enzymes: Secondary | ICD-10-CM

## 2010-08-14 DIAGNOSIS — I10 Essential (primary) hypertension: Secondary | ICD-10-CM

## 2010-08-14 DIAGNOSIS — I2510 Atherosclerotic heart disease of native coronary artery without angina pectoris: Secondary | ICD-10-CM

## 2010-08-14 DIAGNOSIS — E785 Hyperlipidemia, unspecified: Secondary | ICD-10-CM

## 2010-08-14 DIAGNOSIS — M1990 Unspecified osteoarthritis, unspecified site: Secondary | ICD-10-CM

## 2010-08-14 DIAGNOSIS — D649 Anemia, unspecified: Secondary | ICD-10-CM

## 2010-08-14 DIAGNOSIS — E039 Hypothyroidism, unspecified: Secondary | ICD-10-CM

## 2010-08-14 DIAGNOSIS — Z125 Encounter for screening for malignant neoplasm of prostate: Secondary | ICD-10-CM

## 2010-08-14 LAB — CBC WITH DIFFERENTIAL/PLATELET
Eosinophils Absolute: 0.5 10*3/uL (ref 0.0–0.7)
Eosinophils Relative: 5 % (ref 0.0–5.0)
Lymphocytes Relative: 17 % (ref 12.0–46.0)
MCHC: 33.4 g/dL (ref 30.0–36.0)
MCV: 88.7 fl (ref 78.0–100.0)
Monocytes Absolute: 0.7 10*3/uL (ref 0.1–1.0)
Neutrophils Relative %: 70.9 % (ref 43.0–77.0)
Platelets: 253 10*3/uL (ref 150.0–400.0)
RBC: 5.14 Mil/uL (ref 4.22–5.81)
WBC: 10.4 10*3/uL (ref 4.5–10.5)

## 2010-08-14 LAB — TSH: TSH: 3.64 u[IU]/mL (ref 0.35–5.50)

## 2010-08-14 LAB — LIPID PANEL
Cholesterol: 126 mg/dL (ref 0–200)
LDL Cholesterol: 65 mg/dL (ref 0–99)
Triglycerides: 153 mg/dL — ABNORMAL HIGH (ref 0.0–149.0)

## 2010-08-14 LAB — BASIC METABOLIC PANEL
BUN: 12 mg/dL (ref 6–23)
CO2: 32 mEq/L (ref 19–32)
Chloride: 104 mEq/L (ref 96–112)
Creatinine, Ser: 1.2 mg/dL (ref 0.4–1.5)
Potassium: 4.4 mEq/L (ref 3.5–5.1)

## 2010-08-14 LAB — HEPATIC FUNCTION PANEL
ALT: 26 U/L (ref 0–53)
AST: 22 U/L (ref 0–37)
Total Bilirubin: 0.6 mg/dL (ref 0.3–1.2)
Total Protein: 7.1 g/dL (ref 6.0–8.3)

## 2010-08-14 LAB — URIC ACID: Uric Acid, Serum: 5.1 mg/dL (ref 4.0–7.8)

## 2010-08-14 LAB — PSA: PSA: 1.3 ng/mL (ref 0.10–4.00)

## 2010-08-15 NOTE — Progress Notes (Signed)
Summary: cpap supplies faxed by Corrie Dandy this morning---refaxing form  Phone Note From Other Clinic   Caller: MARY WITH [facility] Call For: Turrell Severt Summary of Call: Mary with [facility] she faxed a from regarding supplies for Mr. Freel CPAP.  The form has five questions that needs to be answered and faxed back. She stated that she faxed the form this morning and wants to confirm that we received this. It can be faxed back to [phone] she can be reached at [phone] ext [phone] Initial call taken by: Vedia Coffer,  August 01, 2010 4:08 PM  Follow-up for Phone Call        Leigh, pls advise if this was recieved. thanks Vernie Murders  August 01, 2010 4:27 PM   i have no forms for this pt.  i have been through all of Dr. Kirstie Mirza papers.  thanks Randell Loop CMA  August 02, 2010 3:22 PM   Additional Follow-up for Phone Call Additional follow up Details #1::        I called and asked that [facility] refax form to the [phone]. Will send msg to Leigh so che can watch for form.Michel Bickers [facility]  August 02, 2010 3:34 PM    Additional Follow-up for Phone Call Additional follow up Details #2::    called mary again since i have not received a  fax for pts cpap supplies as of today---she will refax paper to triage fax and i will watch for this today Randell Loop [facility]  August 07, 2010 4:06 PM    form received and placed in SN sign folder Randell Loop CMA  August 07, 2010 4:31 PM    forms have been signed by [facility] and faxed back to the requested fax number. placed in the scan folder Randell Loop [facility]  August 08, 2010 2:37 PM

## 2010-08-15 NOTE — Procedures (Addendum)
Summary: Colonoscopy  Patient: Artyom Stencel Note: All result statuses are Final unless otherwise noted.  Tests: (1) Colonoscopy (COL)   COL Colonoscopy           DONE     [facility]     [address]     [place], [place]  [address]          COLONOSCOPY PROCEDURE REPORT          PATIENT:  Jes, Costales  MR#:  [id number]     BIRTHDATE:  1946-02-10, 64 yrs. old  GENDER:  male     ENDOSCOPIST:  Wilhemina Bonito. Eda Keys, MD     REF. BY:  Surveillance Program Recall,     PROCEDURE DATE:  08/11/2010     PROCEDURE:  Colonoscopy with snare polypectomy x 1     ASA CLASS:  Class III     INDICATIONS:  history of pre-cancerous (adenomatous) colon polyps,     rectal bleeding ; last exam 2005 w/o polyps     MEDICATIONS:   MAC sedation, administered by CRNA, propofol     (Diprivan) 330 mg IV          DESCRIPTION OF PROCEDURE:   After the risks benefits and     alternatives of the procedure were thoroughly explained, informed     consent was obtained.  Digital rectal exam was performed and     revealed no abnormalities.   The LB CF-[id number] endoscope     was introduced through the anus and advanced to the cecum, which     was identified by both the appendix and ileocecal valve, without     limitations.Time to cecum = 3:40 min.  The quality of the prep was     excellent, using MoviPrep.  The instrument was then slowly     withdrawn (time = 12:35 min) as the colon was fully examined.     <<PROCEDUREIMAGES>>          FINDINGS:  A diminutive polyp was found in the transverse colon.     Polyp was snared without cautery. Retrieval was successful. Severe     diverticulosis was found throughout the colon.  Otherwise normal     colonoscopy without other polyps, masses, vascular ectasias, or     inflammatory changes.   Retroflexed views in the rectum revealed     internal hemorrhoids.    The scope was then withdrawn from the     patient and the procedure completed.          COMPLICATIONS:   None          ENDOSCOPIC IMPRESSION:     1) Diminutive polyp in the transverse colon - removed     2) Severe diverticulosis throughout the colon     3) Otherwise nl colonoscopy     4) Internal hemorrhoids          RECOMMENDATIONS:     1) Follow up colonoscopy in 5 years          ______________________________     Wilhemina Bonito. Eda Keys, MD          CC:  Michele Mcalpine, MD; The Patient          n.     eSIGNED:   Wilhemina Bonito. Eda Keys at 08/11/2010 03:01 PM          Joselyn Arrow, [id number]  Note: An exclamation mark (!) indicates a result that was not dispersed  into the flowsheet. Document Creation Date: 08/11/2010 3:01 PM _______________________________________________________________________  (1) Order result status: Final Collection or observation date-time: 08/11/2010 14:54 Requested date-time:  Receipt date-time:  Reported date-time:  Referring Physician:   Ordering Physician: Fransico Setters [id number]) Specimen Source:  Source: Launa Grill Order Number: [id number] Lab site:   Appended Document: Colonoscopy Recall colon in 5 years  Appended Document: Colonoscopy     Procedures Next Due Date:    Colonoscopy: 07/2015

## 2010-08-15 NOTE — Procedures (Addendum)
Summary: Upper Endoscopy  Patient: [name] Note: All result statuses are Final unless otherwise noted.  Tests: (1) Upper Endoscopy (EGD)   EGD Upper Endoscopy       DONE     [facility]     [address]     [place], [place]  [address]          ENDOSCOPY PROCEDURE REPORT          PATIENT:  Henry, Green  MR#:  [id number]     BIRTHDATE:  [birth year], 64 yrs. old  GENDER:  male          ENDOSCOPIST:  Wilhemina Bonito. Eda Keys, MD     Referred by:  Office          PROCEDURE DATE:  08/11/2010     PROCEDURE:  EGD, diagnostic 43235,     Maloney Dilation of Esophagus -     36F     ASA CLASS:  Class III     INDICATIONS:  dysphagia, abdominal pain          MEDICATIONS:   MAC sedation, administered by CRNA, propofol     (Diprivan) 130 mg IV     TOPICAL ANESTHETIC:          DESCRIPTION OF PROCEDURE:   After the risks benefits and     alternatives of the procedure were thoroughly explained, informed     consent was obtained.  The LB GIF-H180 [id number] endoscope was     introduced through the mouth and advanced to the second portion of     the duodenum, without limitations.  The instrument was slowly     withdrawn as the mucosa was fully examined.     <<PROCEDUREIMAGES>>          A ring-like stricture was found in the distal esophagus.     Otherwise normal esophagus.  The stomach was entered and closely     examined. The antrum, angularis, and lesser curvature were well     visualized, including a retroflexed view of the cardia and fundus.     The stomach wall was normally distensable. The scope passed easily     through the pylorus into the duodenum.  The duodenal bulb was     normal in appearance, as was the postbulbar duodenum.     Retroflexed views revealed a small hiatal hernia.    The scope was     then withdrawn from the patient and the procedure completed.          THERAPY: 36F MALONEY DILATION W/O RESISTANCE OR HEME. 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 Resume     prior diet tomorrow.     2) continue current medications     3) RESUME PLAVIX TODAY          _____________________________     Wilhemina Bonito. Eda Keys, MD          CC:  Michele Mcalpine, MD; The Patient          n.     eSIGNED:  Wilhemina Bonito. Eda Keys at 08/11/2010 03:13 PM          Joselyn Arrow, [id number]  Note: An exclamation mark (!) indicates a result that was not dispersed into the flowsheet. Document Creation Date: 08/11/2010 3:13 PM _______________________________________________________________________  (1) Order result status: Final Collection or observation date-time: 08/11/2010 15:06 Requested date-time:  Receipt date-time:  Reported date-time:  Referring Physician:   Ordering Physician: Fransico Setters [phone]) Specimen Source:  Source: Launa Grill Order Number: [id number] Lab site:

## 2010-08-15 NOTE — Progress Notes (Signed)
Summary: cpap form/ 5 questions  Phone Note From Other Clinic   Caller: mary w/ [facility] Call For: Rubi Tooley Summary of Call: caller says that she was to receive a form re: cpap (5 questions that were to be answered that "had not been answered). she says a paper was faxed back (on 2/21) but this was not the from that was faxed to dr Meri Pelot. pls correct and fax back to mary at [phone]. her contact # is [phone] Initial call taken by: Tivis Ringer, CNA,  August 09, 2010 4:19 PM  Follow-up for Phone Call        both forms have been refaxed to mary Randell Loop [facility]  August 09, 2010 4:43 PM

## 2010-08-15 NOTE — Progress Notes (Signed)
Summary: need corrected form faxed  [id number]  Phone Note From Other Clinic   Caller: MARY WITH [facility] Call For: [name] Summary of Call: Corrie Dandy phoned stated that the computer kicked the orders back out due to write over. She needs a corrected form faxed we need to x out the date, initial by the x  and write the correct date and have the doctor sign it. Please fax the form to [facility] [phone]  Initial call taken by: Vedia Coffer,  August 10, 2010 11:57 AM  Follow-up for Phone Call        Marliss Czar, have you received a return fax for corrections from [facility] on this patient? Pls advise.Michel Bickers [facility]  August 10, 2010 1:36 PM  Additional Follow-up for Phone Call Additional follow up Details #1::        no have not received a form for corrected info---i only have the original that i have faxed back to her 2 days in a row.  she will need to refax the form if she needs this corrected unless we can use the old one.  thanks Randell Loop CMA  August 10, 2010 1:46 PM     Additional Follow-up for Phone Call Additional follow up Details #2::    [id number].  Aundra Millet Reynolds LPN  August 10, 2010 2:29 PM   Mary from [facility] returned my call.  Requested she refax form to our office, which she stated she will do now.  Awaiting fax.  Aundra Millet Reynolds LPN  August 10, 2010 4:45 PM   received fax and gave to [facility] to have SN make corrections.  Aundra Millet Reynolds LPN  August 10, 2010 4:47 PM    form has been corrected and initialed and signed by SN again and faxed back to mary Randell Loop CMA  August 11, 2010 8:44 AM

## 2010-08-17 ENCOUNTER — Encounter: Payer: Self-pay | Admitting: Internal Medicine

## 2010-08-24 NOTE — Miscellaneous (Signed)
Summary: Orders Update   Clinical Lists Changes  Orders: Added new Test order of TLB-Lipid Panel (80061-LIPID) - Signed Added new Test order of TLB-BMP (Basic Metabolic Panel-BMET) (80048-METABOL) - Signed Added new Test order of TLB-CBC Platelet - w/Differential (85025-CBCD) - Signed Added new Test order of TLB-Hepatic/Liver Function Pnl (80076-HEPATIC) - Signed Added new Test order of TLB-TSH (Thyroid Stimulating Hormone) (84443-TSH) - Signed Added new Test order of TLB-Uric Acid, Blood (84550-URIC) - Signed Added new Test order of TLB-PSA (Prostate Specific Antigen) (84153-PSA) - Signed  Appended Document: Orders Update lab order cancelled in emr and placed in epic

## 2010-08-24 NOTE — Medication Information (Signed)
Summary: [facility]  [facility]   Imported By: Lester Parole 08/15/2010 09:26:23  _____________________________________________________________________  External Attachment:    Type:   Image     Comment:   External Document

## 2010-08-24 NOTE — Letter (Addendum)
Summary: Patient Notice- Polyp Results  [facility]  [address] [place], [place] [address]   Phone: [phone]  Fax: [phone]        August 17, 2010 MRN: [id number]    [facility] [address] [place], [place]  [address]    Dear [name],  I am pleased to inform you that the colon polyp(s) removed during your recent colonoscopy was (were) found to be benign (no cancer detected) upon pathologic examination.  I recommend you have a repeat colonoscopy examination in 5 years to look for recurrent polyps, as having colon polyps increases your risk for having recurrent polyps or even colon cancer in the future.  Should you develop new or worsening symptoms of abdominal pain, bowel habit changes or bleeding from the rectum or bowels, please schedule an evaluation with either your primary care physician or with me.   Additional information/recommendations:  __ No further action with gastroenterology is needed at this time. Please      follow-up with your primary care physician for your other healthcare      needs.   Please call us if you are having persistent problems or have questions about your condition that have not been fully answered at this time.  Sincerely,  Hilarie Fredrickson MD  This letter has been electronically signed by your physician.  Appended Document: Patient Notice- Polyp Results letter mailed

## 2010-08-28 ENCOUNTER — Telehealth: Payer: Self-pay | Admitting: Internal Medicine

## 2010-08-29 NOTE — Assessment & Plan Note (Signed)
Summary: 6 months/apc   Primary Care Provider:  Alroy Dust, MD  CC:  6 month ROV & review of mult medical problems....  History of Present Illness: 65 y/o WM here for a follow up visit... he has multiple medical problems including OSA on CPAP;  HBP controlled on 5 drug regimen;  CAD- s/p CABGx4 in 2002 & followed by DrBensimhon;  Hyperlipidemia on Lovastatin;  Obesity & just can't seen to lose the weight;  GERD/ Divertics/ Polyps followed by DrPerry for GI;  DJD & hx hyperuricemia;  Anxiety...   ~  February 14, 2010:  c/o some left sholder pain> treatedw/ Motrin, Skelaxin, Ice, etc> improved...  he had one epis of CP after eating pizza & it resolved w/ antacids... he saw DrBensimhon 3/11> HBP, CAD s/p CABG, Hyperlipidemia & Obesity... atypic CP & Stress Test was neg- no signif ST segment depression... also had ArtDoppler's of LE's w/ norm ABI's & no evid obstruction...  BP controlled on meds;  Chol looks OK on Lovastatin;  wt down sl to 251# but he has a long way to go!   ~  August 14, 2010:  61mo ROV- CC= left shoulder & arm pain x2-3 months & he takes Vicodin w/ relief but hasn't seen Ortho for eval & he will call DrBeane when he is ready;  note- hx hyperuricemia, on Allopurinol 300mg /d & Uric= 5.1 today...    He saw DrPerry 1/12 w/ reflux symptoms, dysphagia, abd pain & rectal bleeding> EGD & Colonoscopy 2/12 showed esoph stricture- dilated, and divertics, hems, & one adenomatous polyp removed (f/u planned 68yrs);  he remains on Protonix daily & Levsin as needed along w/ laxatives Prn.    He saw DrBensimhon 1/12 for Cardiac eval prior to his procedures> felt to be stable, BP controlled, & doing satis overall- OK to hold Plavix for the procedures;  continued on his med regimen w/ Labetolol, Catapress, Amlodipine, Lisinopril, HCTZ, ASA/ Plavix.    He remains on Lovastatin80 for his Chol> FLP today looks good x sl incr TG 153 & low HDL 31;  discussed low fat diet & exercise program (difficult for him  due to arthritis).    Otherw stable> we reviewed meds & he requests 90 refills;  Fasting labs reviewed;  last CXR 3/11 showed NAD- s/p CABG, scarring right mid lung zone, spondylosis Tspine...    Current Problem List:  ALLERGIC RHINITIS (ICD-477.9) - advised not to use Afrin etc... rather take OTC antihist (Claritin, Zyrtek, etc), Saline, FLONASE Qhs...  OBSTRUCTIVE SLEEP APNEA (ICD-327.23) - on CPAP regularly (can't locate sleep study results), doing well by his report.  HYPERTENSION (ICD-401.9) - well controlled on a 5 drug regimen: LABETALOL 200mg Tid,  CATAPRES 0.1mg Tid,  AMLODIPINE 10mg /d,  LISINOPRIL 40mg /d,  HCTZ 25mg /d... takes meds regularly and tol well... BP=110/68 today- denies HA, fatigue, visual changes, CP, palipit, dizziness, syncope, dyspnea, edema, etc... needs better diet, continue same meds.  ~  CXR 3/11 is clear, WNL (heart upper limit of norm).  CORONARY ARTERY DISEASE (ICD-414.00) - on above meds + ASA 81mg /d & PLAVIX 75mg /d... S/P CABG x4 9/02 by DrOwen... subseq PTCA/stent to native CIRC 6/03 (& occluded vein graft to LAD noted); last cath = 2/06 w/ PTCA/stent in LAD distal to LIMA touchdown, otherw no change... NuclearStressTest 9/07 showed neg w/ EF=62%...  ~  3/10:  yearly ROV DrBensimhon doing well, no changes made...  ~  3/11:  stable- StressTest was neg- no signif ST seg depression; ArtDopplers LEs showed norm ABIs.  HYPERLIPIDEMIA (ICD-272.4) - back on LOVASTATIN 80mg /d (40mg - 2tabsQhs) due to $$$... + FISH OIL 3/d...  ~  FLP 7/08 on Lovastatin showed TChol 157, TG 127, HDL 30, LDL 101... ch to LIP40 per Cards...  ~  FLP 1/09 on Lip40 showed TChol 125, Tg 105, HDL 31, LDL 73... ch back to Lovastin80 due to $$$...  ~  FLP 7/09 on Lova80 showed TChol 142, TG 111, HDL 31, LDL 89... rec- continue same, get wt down.  ~  FLP 1/10 showed TChol 130, TG 99, HDL 30, LDL 80... rec> get wt down or stronger statin Rx!  ~  FLP 3/11 showed TChol 138, TG 96, HDL 43, LDL 76...  continue same.  ~  FLP 8/11 showed TChol 140, TG 145, HDL 33, LDL 78  OBESITY (ICD-278.00)  ~  7/09:  he's done a better job w/ diet + exercise and weight down 13# in 78mo to 242#.  ~  1/10:  unfortunately weight back up 11# over the holidays to 253#... rec- diet/ exercise discussed.  ~  10/10: weight = 260# but pt notes he & wife to start diet soon!!!  ~  3/11:  weight = 260#  ~  8/11:  weight = 251#.Marland Kitchen. states "my wife keeps on bringing it home"  GERD (ICD-530.81) - last EGD by DrSam 3/06 revealed severe antral gastitis.Marland Kitchen. now on PROTONIX 40mg /d, and PEPCID 20mg Qhs...  DIVERTICULOSIS OF COLON (ICD-562.10) & COLONIC POLYPS (ICD-211.3) - last colonoscopy 7/05 by DrSam showed divertics, hems, otherw neg... f/u planned 75yrs.  RENAL INSUFFICIENCY (ICD-588.9) - he knows to avoid NSAIDs and nephrotoxic drugs...  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  DEGENERATIVE JOINT DISEASE (ICD-715.90) & HYPERURICEMIA (ICD-790.6) - as per eval DrBeane w/ ?gout- Rx'd w/ left knee shot, Indocin... back on ALLOPURINOL 300mg /d now...  ~  labs 3/09 showed Uric= 8.7...  ~  labs 7/09 showed Uric= 5.6.Marland KitchenMarland Kitchen rec- continue Allopurinol.  ~  9/10: dropped timber on left foot w/ hematoma, cellulitis- Rx'd...  ~  labs 9/10 showed Uric= 5.9 on Uloric40.  ~  labs 3/11 showed URIC= 6.3 back on Allopurinol 300mg /d.  BACK PAIN, LUMBAR (ICD-724.2)  ANXIETY (ICD-300.00)   Preventive Screening-Counseling & Management  Alcohol-Tobacco     Smoking Status: quit     Year Started: 1957     Year Quit: 2004     Pack years: 34yrs, 1/2 ppd  Caffeine-Diet-Exercise     Does Patient Exercise: no  Allergies (verified): No Known Drug Allergies  Comments:  Nurse/Medical Assistant: The patient's medications and allergies  were reviewed with the patient and were updated in the Medication and Allergy Lists.  Past History:  Past Medical History:  1. CAD     a. s/p stenting of LAD (2006) and LCX (2003) with drug-eluting stents     b. s/p CABG 2002         --LIMA-LAD         --RIMA-PDA         --SVG-Diag1-PL (distal limb occluded)         --SVG-Diag3 (occluded)    c. EF 50-55% echo 2/06  ALLERGIC RHINITIS (ICD-477.9) OBSTRUCTIVE SLEEP APNEA (ICD-327.23) HYPERTENSION (ICD-401.9) CORONARY ARTERY DISEASE (ICD-414.00) HYPERLIPIDEMIA (ICD-272.4) OBESITY (ICD-278.00) GERD (ICD-530.81) DIVERTICULOSIS OF  COLON (ICD-562.10) COLONIC POLYPS (ICD-211.3) RENAL INSUFFICIENCY (ICD-588.9) ORGANIC IMPOTENCE (ICD-607.84) DEGENERATIVE JOINT DISEASE (ICD-715.90) BACK PAIN, LUMBAR (ICD-724.2) HYPERURICEMIA (ICD-790.6) ANXIETY (ICD-300.00) Hemorrhoids Chronic Gastritis Herpes  Past Surgical History: S/P CABG - 2002 Angioplasty Cholecystectomy - S/P Lap Chole 2006 by DrHoxworth  Family History: Reviewed history from 06/21/2010 and no changes required. mother deceased at age 85 due to heart disease father deceased at age 38 to heart disease brother living at 62, hx of heart disease No FH of Colon Cancer: Family History of Diabetes: Mother, Brother  Social History: Reviewed history from 06/21/2010 and no changes required. Lives in [place] with his significant other.  Tobacco; quit smoking in 2002.   Alcohol; not excessive.  -2 drinks per day No illicit drugs. patient is married with 1 child Patient does not get regular exercise.   Review of Systems      See HPI       The patient complains of dyspnea on exertion and difficulty walking.  The patient denies anorexia, fever, weight loss, weight gain, vision loss, decreased hearing, hoarseness, chest pain, syncope, peripheral edema, prolonged cough, headaches, hemoptysis, abdominal pain, melena, hematochezia, severe indigestion/heartburn, hematuria,  incontinence, muscle weakness, suspicious skin lesions, transient blindness, depression, unusual weight change, abnormal bleeding, enlarged lymph nodes, and angioedema.    Vital Signs:  Patient profile:   65 year old male Height:      69 inches Weight:      256.13 pounds BMI:     37.96 O2 Sat:      98 % on Room air Temp:     97.7 degrees F oral Pulse rate:   50 / minute BP sitting:   128 / 76  (left arm) Cuff size:   regular  Vitals Entered By: Randell Loop CMA (August 14, 2010 11:20 AM)  O2 Sat at Rest %:  98 O2 Flow:  Room air CC: 6 month ROV & review of mult medical problems... Is Patient Diabetic? No Pain Assessment Patient in pain? yes      Onset of pain  left arm/shoulder pain with its cold or wet outside Comments meds updated today with pt   Physical Exam  Additional Exam:  WD, Obese, 65 y/o WM in NAD... GENERAL:  Alert & oriented; pleasant & cooperative... HEENT:  [place]/AT, EOM-wnl, PERRLA, EACs-clear, TMs-wnl, NOSE-clear, THROAT-clear & wnl. NECK:  Supple w/ fairROM; no JVD; normal carotid impulses w/o bruits; no thyromegaly or nodules palpated; no lymphadenopathy. CHEST:  Median sternotomy scar, clear to P & A; without wheezes/ rales/ or rhonchi. HEART:  Regular Rhythm; without murmurs/ rubs/ or gallops heard. ABDOMEN:  Obese, soft & nontender; normal bowel sounds; no organomegaly or masses detected. EXT: without deformities, mild arthritic changes; no varicose veins/ +venous insuffic, tr edema... NEURO:  CN's intact; motor testing normal; sensory testing normal; gait normal & balance OK. DERM:  No lesions noted; no rash etc...    Impression & Recommendations:  Problem # 1:  OBSTRUCTIVE SLEEP APNEA (ICD-327.23) Stable on his CPAP, no issues w/ mask/ interface, machine, etc...  Problem # 2:  HYPERTENSION (ICD-401.9) BP controlled on meds>  continue same regimen. His updated medication list for this problem includes:    Labetalol Hcl 200 Mg Tabs (Labetalol  hcl) .Marland Kitchen... 1 tab by mouth three times a day    Catapres 0.1 Mg Tabs (Clonidine hcl) ..... One tab three times daily    Amlodipine Besylate 10 Mg Tabs (Amlodipine besylate) .Marland Kitchen... 1 tab daily    Lisinopril 40 Mg  Tabs (Lisinopril) .Marland Kitchen... 1 tab daily    Hydrochlorothiazide 25 Mg Tabs (Hydrochlorothiazide) .Marland Kitchen... 1 tab daily  Problem # 3:  CORONARY ARTERY DISEASE (ICD-414.00) Followed by DrBensimhon for Cards w/ recent procedural clearance & he did well... His updated medication list for this problem includes:    Bayer Low Strength 81 Mg Tbec (Aspirin) .Marland Kitchen... Take 1 tablet by mouth once a day    Plavix 75 Mg Tabs (Clopidogrel bisulfate) .Marland Kitchen... Take 1 tablet by mouth once a day    Nitrostat 0.4 Mg Subl (Nitroglycerin) .Marland Kitchen... 1 under tongue as needed for chest pain...    Labetalol Hcl 200 Mg Tabs (Labetalol hcl) .Marland Kitchen... 1 tab by mouth three times a day    Catapres 0.1 Mg Tabs (Clonidine hcl) ..... One tab three times daily    Amlodipine Besylate 10 Mg Tabs (Amlodipine besylate) .Marland Kitchen... 1 tab daily    Lisinopril 40 Mg Tabs (Lisinopril) .Marland Kitchen... 1 tab daily    Hydrochlorothiazide 25 Mg Tabs (Hydrochlorothiazide) .Marland Kitchen... 1 tab daily  Problem # 4:  GERD (ICD-530.81) Eval by DrPerry w/ EGD showing stricture, dilated, & improved... continue PPI Rx. His updated medication list for this problem includes:    Protonix 40 Mg Tbec (Pantoprazole sodium) .Marland Kitchen... 1 by mouth once daily    Levsin 0.125 Mg Tabs (Hyoscyamine sulfate) .Marland Kitchen... 1 under tongue every 4hrs as needed abdominal cramping  Problem # 5:  COLONIC POLYPS (ICD-211.3) Colonoscopy revealed divertics, hems, adenomatous polyp removed...  Problem # 6:  DEGENERATIVE JOINT DISEASE (ICD-715.90) This was his CC> he will f/u w/ DrBeane when he is ready... Vicodin helps his pain. His updated medication list for this problem includes:    Bayer Low Strength 81 Mg Tbec (Aspirin) .Marland Kitchen... Take 1 tablet by mouth once a day    Vicodin 5-500 Mg Tabs (Hydrocodone-acetaminophen)  .Marland Kitchen... 1 by mouth every 4-6 hr as needed pain  Problem # 7:  OTHER MEDICAL PROBLEMS AS NOTED>>>  Complete Medication List: 1)  Fluticasone Propionate 50 Mcg/act Susp (Fluticasone propionate) .... 2 sp in each nostril at bedtime.Marland KitchenMarland Kitchen 2)  Bayer Low Strength 81 Mg Tbec (Aspirin) .... Take 1 tablet by mouth once a day 3)  Plavix 75 Mg Tabs (Clopidogrel bisulfate) .... Take 1 tablet by mouth once a day 4)  Nitrostat 0.4 Mg Subl (Nitroglycerin) .Marland Kitchen.. 1 under tongue as needed for chest pain.Marland KitchenMarland Kitchen 5)  Labetalol Hcl 200 Mg Tabs (Labetalol hcl) .Marland Kitchen.. 1 tab by mouth three times a day 6)  Catapres 0.1 Mg Tabs (Clonidine hcl) .... One tab three times daily 7)  Amlodipine Besylate 10 Mg Tabs (Amlodipine besylate) .Marland Kitchen.. 1 tab daily 8)  Lisinopril 40 Mg Tabs (Lisinopril) .Marland Kitchen.. 1 tab daily 9)  Hydrochlorothiazide 25 Mg Tabs (Hydrochlorothiazide) .Marland Kitchen.. 1 tab daily 10)  Lovastatin 40 Mg Tabs (Lovastatin) .... Take two  tablets by mouth daily at bedtime 11)  Fish Oil Oil (Fish oil) .Marland Kitchen.. 1200 mg three times a day 12)  Protonix 40 Mg Tbec (Pantoprazole sodium) .Marland Kitchen.. 1 by mouth once daily 13)  Levsin 0.125 Mg Tabs (Hyoscyamine sulfate) .Marland Kitchen.. 1 under tongue every 4hrs as needed abdominal cramping 14)  Allopurinol 300 Mg Tabs (Allopurinol) .... Take 1 tablet by mouth once a day 15)  Vicodin 5-500 Mg Tabs (Hydrocodone-acetaminophen) .Marland Kitchen.. 1 by mouth every 4-6 hr as needed pain 16)  Acyclovir 200 Mg Caps (Acyclovir) .... Take 3 tabs daily as directed...  Patient Instructions: 1)  Today we updated your med list- see below.... 2)  We refilled your  meds for 2012... 3)  Today we did your fasting blood work... 4)  please call the "phone tree" in a few days for your lab results.Marland KitchenMarland Kitchen 5)  Let's get on track w/ our diet & exercise program... 6)  The goal is to lose 15-20 lbs... 7)  Call for any problems... 8)  Please schedule a follow-up appointment in 6 months. Prescriptions: VICODIN 5-500 MG TABS (HYDROCODONE-ACETAMINOPHEN) 1 by  mouth every 4-6 hr as needed pain  #100 x 6   Entered and Authorized by:   Michele Mcalpine MD   Signed by:   Michele Mcalpine MD on 08/14/2010   Method used:   Print then Give to Patient   RxID:   [id number] ALLOPURINOL 300 MG TABS (ALLOPURINOL) Take 1 tablet by mouth once a day  #90 x 4   Entered and Authorized by:   Michele Mcalpine MD   Signed by:   Michele Mcalpine MD on 08/14/2010   Method used:   Print then Give to Patient   RxID:   [id number] LEVSIN 0.125 MG  TABS (HYOSCYAMINE SULFATE) 1 under tongue every 4hrs as needed abdominal cramping  #100 x 12   Entered and Authorized by:   Michele Mcalpine MD   Signed by:   Michele Mcalpine MD on 08/14/2010   Method used:   Print then Give to Patient   RxID:   [id number] PROTONIX 40 MG  TBEC (PANTOPRAZOLE SODIUM) 1 by mouth once daily  #90 x 4   Entered and Authorized by:   Michele Mcalpine MD   Signed by:   Michele Mcalpine MD on 08/14/2010   Method used:   Print then Give to Patient   RxID:   [id number] LOVASTATIN 40 MG TABS (LOVASTATIN) Take two  tablets by mouth daily at bedtime  #180 x 4   Entered and Authorized by:   Michele Mcalpine MD   Signed by:   Michele Mcalpine MD on 08/14/2010   Method used:   Print then Give to Patient   RxID:   [id number] HYDROCHLOROTHIAZIDE 25 MG  TABS (HYDROCHLOROTHIAZIDE) 1 tab daily  #90 x 4   Entered and Authorized by:   Michele Mcalpine MD   Signed by:   Michele Mcalpine MD on 08/14/2010   Method used:   Print then Give to Patient   RxID:   [id number] LISINOPRIL 40 MG  TABS (LISINOPRIL) 1 tab daily  #90 x 4   Entered and Authorized by:   Michele Mcalpine MD   Signed by:   Michele Mcalpine MD on 08/14/2010   Method used:   Print then Give to Patient   RxID:   [id number] AMLODIPINE BESYLATE 10 MG  TABS (AMLODIPINE BESYLATE) 1 tab daily  #90 x 4   Entered and Authorized by:   Michele Mcalpine MD   Signed by:   Michele Mcalpine MD on 08/14/2010   Method used:   Print then Give to Patient   RxID:    [id number] CATAPRES 0.1 MG  TABS (CLONIDINE HCL) one tab three times daily  #270 x 4   Entered and Authorized by:   Michele Mcalpine MD   Signed by:   Michele Mcalpine MD on 08/14/2010   Method used:   Print then Give to Patient   RxID:   [id number] LABETALOL HCL 200 MG  TABS (LABETALOL HCL) 1 tab by mouth three times a day  #270  x 4   Entered and Authorized by:   Michele Mcalpine MD   Signed by:   Michele Mcalpine MD on 08/14/2010   Method used:   Print then Give to Patient   RxID:   [id number] NITROSTAT 0.4 MG  SUBL (NITROGLYCERIN) 1 under tongue as needed for chest pain...  #25 x 12   Entered and Authorized by:   Michele Mcalpine MD   Signed by:   Michele Mcalpine MD on 08/14/2010   Method used:   Print then Give to Patient   RxID:   [id number] PLAVIX 75 MG  TABS (CLOPIDOGREL BISULFATE) Take 1 tablet by mouth once a day  #30 x 12   Entered and Authorized by:   Michele Mcalpine MD   Signed by:   Michele Mcalpine MD on 08/14/2010   Method used:   Print then Give to Patient   RxID:   [id number] FLUTICASONE PROPIONATE 50 MCG/ACT SUSP (FLUTICASONE PROPIONATE) 2 sp in each nostril at bedtime...  #1 x 12   Entered and Authorized by:   Michele Mcalpine MD   Signed by:   Michele Mcalpine MD on 08/14/2010   Method used:   Print then Give to Patient   RxID:   [id number]    Immunization History:  Influenza Immunization History:    Influenza:  historical (03/28/2010)

## 2010-09-05 NOTE — Progress Notes (Signed)
Summary: question   Phone Note Call from Patient Call back at Home Phone [phone]   Caller: Patient Reason for Call: Talk to Nurse, Talk to Doctor Summary of Call: pt scheduled his yearly f/u and wants to know if he needs a myoview first Initial call taken by: Omer Jack,  August 28, 2010 3:32 PM  Follow-up for Phone Call        Left message to call back Meredith Staggers, RN  August 28, 2010 4:34 PM   spoke w/pt no stress test needed at this time, he has been doing ok, has appt for 5/29 Meredith Staggers, RN  August 29, 2010 2:48 PM

## 2010-10-18 ENCOUNTER — Telehealth: Payer: Self-pay | Admitting: Pulmonary Disease

## 2010-10-18 MED ORDER — ACYCLOVIR 200 MG PO CAPS: 200.0000 mg | ORAL_CAPSULE | Freq: Three times a day (TID) | ORAL | Status: AC

## 2010-10-18 NOTE — Telephone Encounter (Signed)
Per TD: okay to fill acyclovir 200mg .  rx sent to [id number] with 5 refills sent to requested pharmacy.

## 2010-10-18 NOTE — Telephone Encounter (Signed)
Spoke w/ pt and he states he needs his acyclovir refilled. Pt states the pharmacy lost his rx for this. Pt was last seen 08/14/10 and is to follow up in  6 months. Please advise Dr. Kriste Basque. Thanks  Carver Fila, CMA

## 2010-10-31 NOTE — Assessment & Plan Note (Signed)
[facility]                            CARDIOLOGY OFFICE NOTE   MORRELL, FLUKE                         MRN:          [id number]  DATE:10/21/2007                            DOB:          1946/02/17    PRIMARY CARE PHYSICIAN:  Dr. Alroy Dust.   INTERVAL HISTORY:  [name] is a very pleasant 65 year old male with a  history of coronary artery disease status post bypass surgery in 2002.  He also has a history of stenting of the circumflex and LAD with drug-  eluting stents.  Remainder of his medical history is for hypertension,  obesity, obstructive sleep apnea and osteoarthritis.   He was recently seen for some atypical chest pain which he thought was  indigestion.  He had a Myoview study which showed an ejection fraction  of 62%.  There is a very mild fixed defect at the base of the anterior  lateral wall but no ischemia.   Since that time, he has not had any further chest pain.  He is having  significant problem with his left knee, and he has had extensive workup  for that.  He denies any dyspnea.  Blood pressure has been somewhat up  and down.   CURRENT MEDICATIONS:  1. Aspirin 81 a day.  2. Plavix 75 a day.  3. Allopurinol 100.  4. Hydrochlorothiazide 25 a day.  5. Clonidine 0.1 t.i.d.  6. Lisinopril 40 a day.  7. Omega 3.  8. Multivitamin.  9. Lovastatin 80 mg at night.  10.Amlodipine 30 a day.  11.Protonix 40 a day.   PHYSICAL EXAMINATION:  He is in no acute distress.  He ambulates around  the clinic without any respiratory difficulty.  Blood pressure is  148/84, heart rate 60, weight 248.  HEENT:  Is normal.  NECK:  Is thick and supple.  No obvious JVD.  Carotid 2+ bilaterally  without bruits.  There is no lymphadenopathy or thyromegaly.  CARDIAC:  PMI is nondisplaced.  He has distant heart sounds.  He is  regular.  No murmurs, rubs or gallops.  LUNGS:  Clear.  ABDOMEN:  Is obese, nontender, nondistended.  No  hepatosplenomegaly, no  bruits, no masses.  Good bowel sounds.  EXTREMITIES:  Are warm with no cyanosis or clubbing.  There is trace  edema bilaterally.  No rash.  No cords.  NEUROLOGICAL:  Alert and x3.  Cranial nerves II-XII intact.  Moves all  four extremities without difficulty.  Affect is pleasant.   ASSESSMENT/PLAN:  1. Coronary artery disease.  This is stable.  This chest pain does not      appear ischemic.  His Myoview is quite reassuring.  He was started      on proton pump inhibitor and will see if this helps.  I told that      he develops reducible exertional pain he needs to contact us right      away.  2. Chronic hypertension.  This is followed by Dr. Kriste Basque.  Blood      pressure is elevated.  I asked him  to keep a blood pressure log and      take it with him to Dr. Kriste Basque.  3. Hyperlipidemia.  This is followed by Dr. Kriste Basque.  Goal LDL is less      than 70.  He was unable to tolerate Lipitor and is back on      lovastatin at a higher dose.  We will see how he does.   DISPOSITION:  Return to clinic in 6-9 months for routine follow-up.     Bevelyn Buckles. Bensimhon, MD  Electronically Signed    DRB/MedQ  DD: 10/21/2007  DT: 10/21/2007  Job #: [id number]

## 2010-10-31 NOTE — Assessment & Plan Note (Signed)
[facility]                            CARDIOLOGY OFFICE NOTE   Henry, Green                         MRN:          [id number]  DATE:01/22/2007                            DOB:          September 16, 1945    PRIMARY CARE PHYSICIAN:  [name], M.D.   INTERVAL HISTORY:  [name] is a delightful 65 year old male with a  history of coronary artery disease, status post bypass surgery in 2002  as well as stenting of the circumflex and LAD with drug-eluting stents.  He also has a history of hypertension, hyperlipidemia, obesity, and  obstructive sleep apnea.  He returns today for routine followup.  Overall, he is doing fairly well.  He began walking about two miles a  day.  He does have some chronic shortness of breath, but this is  unchanged.  It is only with exertion and not at rest.  He does not have  any chest pain.  No orthopnea or PND.  The main problem is that he is  having pain in his right lower leg.  This is primarily after walking but  can hurt all day long and occasionally wakes him from sleep.  He was  initially diagnosed with gout, but the pain has recurred.  He has had  some swelling in the leg, which he attributes to his previous bypass  surgery and saphenous vein graft removal.   CURRENT MEDICATIONS:  1. Aspirin 81.  2. Plavix 75.  3. Labetalol 200 t.i.d.  4. Allopurinol 100 a day.  5. HCTZ 25.  6. Lovastatin 40.  7. Clonidine 0.1 t.i.d.  8. Lisinopril 40 a day.  9. Omega 3.  10.Acyclovir 200 b.i.d.  11.Pepcid 20 b.i.d.  12.Amlodipine 10 a day.  13.Multivitamins.   PHYSICAL EXAMINATION:  GENERAL:  He is in no acute distress.  He  ambulates around the clinic without any respiratory difficulty.  Blood pressure checked manually is 110/74.  Heart rate is 52.  Weight is  247, which is up 2 pounds.  HEENT:  Normal.  NECK:  Supple.  No JVD.  Carotids are 2+ bilaterally without any bruits.  There is no lymphadenopathy or  thyromegaly.  CARDIAC:  He is regular and bradycardic.  PMI is nondisplaced.  There  are no obvious murmurs or rubs.  There is a soft S4.  LUNGS:  Clear.  ABDOMEN:  Obese, nontender, nondistended.  Unable to appreciate any  hepatosplenomegaly.  No bruits.  No masses.  Good bowel sounds.  EXTREMITIES:  Warm with no clubbing or cyanosis.  He has 1-2+ edema in  the right lower extremity.  There are no cords.  There is no erythema or  significant arthropathy.  No rash.  DP pulses 2+ on the right.  NEURO:  He is alert and oriented x3.  Cranial nerves II-XII are intact.  Moves all four extremities without difficulty.  Affect is pleasant.   ASSESSMENT/PLAN:  1. Coronary artery disease:  This is stable without any evidence of      recurrent ischemia.  He is on an excellent medical regimen.  2. Hypertension:  He is on multiple antihypertensives but has good      control, continue current therapy.  3. Hyperlipidemia:  The most recent LDL was about 100.  Given his      coronary disease, we would like to get this down to 70.  We will      change his Lovastatin to Lipitor 40.  4. Right lower extremity in pain:  I suspect this is arthritic in      nature due to his new walking program; however, for certainty sake,      we will check a lower extremity ultrasound to rule out deep venous      thrombosis and any vascular insufficiency.  5. Obesity:  He is aware that he needs to continue his diet program      and exercise.   We will see him back in six months for routine followup.  He can wear  compression hose to the leg as needed.     Henry Buckles. Bensimhon, MD  Electronically Signed    DRB/MedQ  DD: 01/22/2007  DT: 01/23/2007  Job #: [id number]   cc:   Henry Cloud. Kriste Basque, MD

## 2010-10-31 NOTE — Consult Note (Signed)
NAME:  [name], [name] NO.:  [id number]   MEDICAL RECORD NO.:  [id number]          PATIENT TYPE:  EMS   LOCATION:  MAJO                         FACILITY:  [facility]   PHYSICIAN:  Pricilla Riffle, MD, FACCDATE OF BIRTH:  1945-11-20   DATE OF CONSULTATION:  09/18/2007  DATE OF DISCHARGE:                                 CONSULTATION   PRIMARY CARE PHYSICIAN:  [name], M.D.   PRIMARY CARDIOLOGIST:  [name], M.D.   CHIEF COMPLAINT:  Chest pain.   HISTORY OF PRESENT ILLNESS:  [name] is a 65 year old male with known  coronary artery disease.  He has a long history of burning chest pain  which he generally gets after meals.  He takes OTC Pepcid 20 mg b.i.d.  and treats symptoms with Maalox which works.  Over the last 3 days, he  has had recurrent symptoms after eating and took Maalox.  He did not  have symptoms after supper last night or after breakfast this morning.  Today, he woke up with burning chest pain as well as nausea and  diarrhea.  He complained of some left arm numbness but admits that he  thinks he slept on that arm.  He took Pepcid and Maalox and his symptoms  improved.  He called the office and was told to take a sublingual  nitroglycerin and come to the emergency room.  He does not feel that the  sublingual nitroglycerin helped his symptoms very much.  He is currently  symptom free.  Yesterday, he stated he had unusual exertion involving using a push  mower.  He then ate dinner at approximately 5:30 and had no indigestion  or burning.  He had a little bit of pain last night but took Maalox and  it instantly went away.  He had a little bit of dizziness today and a  few chills but did not take his temperature.  After he took the Maalox,  he was able to eat breakfast and not have any symptoms and felt pretty  good.  However, after he called the office he was told to take  nitroglycerin and come to the emergency room which he did.  In  February 2009, he discussed with Dr. Kriste Green the fact that he did not  think that his Pepcid was strong enough because he was having recurrent  indigestion.   PAST MEDICAL HISTORY:  1. Status post coronary artery bypass surgery in September 2002 with      LIMA to LAD, RIMA to RCA, SVG to D-3, and SVG to D-1 and PL.  2. Status post drug-eluting stent to the circumflex in 2003.  3. Drug-eluting stent to the LAD in 2006.  4. Status post cardiac catheterization in 2006 with patent LIMA to      LAD, distal LAD lesion treated with stent, RIMA to PDA patent, SVG      to D-1 patent, SVG to D-3 and PL previously occluded.  5. Preserved left ventricular function with an EF of 60% by cath in      2002.  6. Hypertension.  7. Hyperlipidemia.  8. Remote history of tobacco use.  9. Family history of coronary artery disease.  10.Obesity.  11.History of gallstones.  12.Gout.  13.Obstructive sleep apnea on CPAP.  14.History of a ruptured ear drum.   PAST SURGICAL HISTORY:  1. Status post cardiac catheterization x3.  2. Bypass surgery.  3. Left ear surgery.  4. Cholecystectomy in 2000.   ALLERGIES:  No known drug allergies.   CURRENT MEDICATIONS:  1. Aspirin 81 mg daily.  2. Plavix 75 mg daily.  3. Labetalol 200 mg t.i.d. in the morning.  4. Norvasc 10 mg daily at lunch.  5. Lisinopril 40 mg daily at supper.  6. Catapres 0.1 mg t.i.d. in the morning.  7. HCTZ 25 mg daily at lunch.  8. Lovastatin 40 mg two tabs nightly.  9. Fish oil 1000 mg t.i.d. in the morning.  10.Pepcid 20 mg b.i.d. in the morning.  11.Allopurinol 100 mg daily.  12.Acyclovir 3 tabs daily as directed.  13.Nitroglycerin sublingual p.r.n.  14.Vicodin 5/500 p.r.n.   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  He  has approximately 15 pack-year history of tobacco use and denies alcohol  or drug abuse.   FAMILY HISTORY:  His  mother died at age 2 of a CVA and his father died  at age 22 of a CVA but neither one had heart disease.  However, he has a  brother approximately his own age who has had bypass surgery.   REVIEW OF SYSTEMS:  He does complain of some chills today.  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  The chest pain is  described above.  It was a burning and reached a 4 or 5 out of 10.  He  has chronic dyspnea on exertion and chronic orthopnea but denies PND,  edema, or palpitations.  He gets dizziness, orthostatic in nature after  taking his morning medications.  He denies claudication symptoms,  coughing, or wheezing.  He has occasional numbness in his left arm,  generally present upon waking, unassociated left side.  He has had  recent increase in pain in his knees felt secondary to gout.  He had  some nausea and diarrhea today and has frequent reflux symptoms.  A full  14-point review of systems is otherwise negative.   PHYSICAL EXAMINATION:  VITAL SIGNS:  Temperature is 97.6, blood pressure  136/86, pulse 60, respiratory rate 20, O2 saturation 97% on room air.  GENERAL:  He is a well-developed, well-nourished, white male in no acute  distress.  HEENT:  Normal.  NECK:  There is no lymphadenopathy, thyromegaly, bruit.  JVP is 1.  CV:  His heart is regular in rate and rhythm with an S1 S2.  No  significant murmur, rub, or gallop.  Distal pulses are intact in all 4  extremities and no femoral bruits are appreciated.  LUNGS:  Clear to auscultation bilaterally.  SKIN:  No rashes or lesions are noted.  ABDOMEN:  Soft and nontender.  He has active bowel sounds with no  hepatosplenomegaly on palpation.  EXTREMITIES:  He has no cyanosis, clubbing, or edema noted.  MUSCULOSKELETAL:  There is no joint deformity or effusion and no spine  or CVA tenderness.  NEUROLOGIC:  He is alert and oriented.  Cranial nerves II-XII grossly  intact.   Chest x-ray shows borderline cardiomegaly  which  is stable but no acute  disease.  EKG is sinus bradycardia, rate 59 with no acute ischemic  changes.   LABORATORY VALUES:  Pending but a D-dimer is less than 0.22 and point-of-  care markers negative x1.   IMPRESSION:  Chest pain.  His pain is atypical and it is not obviously  cardiac.  He was active yesterday with no symptoms and his symptoms in  the past have been generally associated with meals.  His symptoms may be  secondary to reflux.  He will be continued on his home medications with  the addition of Protonix 40 mg twice a day.  We will recheck his cardiac  enzymes one more time and if they are negative, then he can be  discharged home with an outpatient Myoview on Monday and follow up with  Dr. Gala Romney.  If his symptoms return, he is to try nitroglycerin and  if they are not relieved come back to the emergency room.      Theodore Demark, PA-C      Pricilla Riffle, MD, [facility]  Electronically Signed    RB/MEDQ  D:  09/18/2007  T:  09/18/2007  Job:  [id number]   cc:   Henry Cloud. Kriste Basque, MD

## 2010-10-31 NOTE — Assessment & Plan Note (Signed)
[name]                            CARDIOLOGY OFFICE Green   [name]                         MRN:          [id number]  DATE:07/25/2007                            DOB:          Nov 14, 1945    PRIMARY CARE PHYSICIAN:  [name].   INTERVAL HISTORY:  [name] is a pleasant 65 year old with a history of  coronary artery disease status post bypass surgery in 2002 as well as  stenting of the circumflex and LAD with drug-eluting stent. He has a  history hypertension, hyperlipidemia, obesity and obstructive sleep  apnea for which he wears CPAP.  He also has a history of gout.  He  presents today for routine follow-up.   Overall, he says he is doing pretty well.  He did have some problems  with some knee pain and a heel spur which kind of limited his walking  program. He is now back on the treadmill and trying to walk as much as  possible. He denies any chest pain or shortness of breath.  He has not  had heart failure symptoms.  He is concerned about the price of his  Lipitor and worried that he might have to go into the donut hole early  if he continues on it.   CURRENT MEDICATIONS:  1. Aspirin 81 a day  2. Plavix 75  3. Labetalol 200 t.i.d.  4. Allopurinol 100 a day.  5. HCTZ 25 a day  6. Clonidine 0.1 t.i.d.  7. Lisinopril 40 a day  8. Omega 3 fatty acids 1000 mg a day  9. Pepcid  10.Acyclovir  11.Amlodipine 10 a day  12.Lipitor 40 a day.   PHYSICAL EXAMINATION:  He is well-appearing, in no acute distress. He  ambulates around the clinic without respiratory difficulty.  Blood  pressure is 120/66, heart rate 53, weight 254 which is up 7 pounds from  previous  HEENT is normal.  NECK is supple.  No JVD.  Carotids are  2+ bilaterally without any  bruits.  There is no lymphadenopathy or thyromegaly.  CARDIAC:  PMI is nondisplaced.  He is regular and bradycardic, no  murmurs, rubs.  There is soft S4.  LUNGS:  Clear  ABDOMEN:   Obese, nontender, nondistended, no hepatosplenomegaly, no  bruits, no masses.  Good  bowel sounds  EXTREMITIES:  Warm with no clubbing or cyanosis.  He has mild edema in  the right lower extremity, no cords, left looks fine.  No rash.  NEURO:  Alert and oriented x3.  Cranial nerves II-XII are intact.  Moves  all four extremities without  difficulty.  Affect is pleasant.   ASSESSMENT AND PLAN:  1. Coronary artery disease is stable without any evidence of ischemia.      Continue current therapy  2. Hypertension.  He is on multiple antihypertensive medications with      good blood pressure control.  I did offer to try and consolidate      some of his medications such as changing labetalol to Coreg, but he      said  that he can comply with these and would like to stay where he      is at.  3. Hyperlipidemia.  We recently switched his lovastatin over to      Lipitor as his LDL was around 100.  He has been following  with Dr.      Kriste Green.  Unfortunately, he is unable to afford Lipitor.  We will      switch him back to high dose lovastatin at 80 mg a day.  This will      be followed by [name].  Goal LDL is to be  down below 70.  4. Obesity.  He is trying to be more active and diet to lose weight.  5. Sleep apnea.  Continue see CPAP.   DISPOSITION:  We will see him back in routine follow-up in 9 months.     Henry Buckles. Bensimhon, MD  Electronically Signed    DRB/MedQ  DD: 07/25/2007  DT: 07/27/2007  Job #: [id number]   cc:   Lonzo Cloud. Kriste Basque, MD

## 2010-11-03 NOTE — Op Note (Signed)
[name], [name]                  ACCOUNT NO.:  [id number]   MEDICAL RECORD NO.:  [id number]          PATIENT TYPE:  AMB   LOCATION:  SDS                          FACILITY:  [facility]   PHYSICIAN:  Sharlet Salina T. Hoxworth, M.D.DATE OF BIRTH:  1945/06/21   DATE OF PROCEDURE:  03/21/2006  DATE OF DISCHARGE:  03/21/2006                                 OPERATIVE REPORT   PREOPERATIVE DIAGNOSES:  1. Cholelithiasis and cholecystitis.  2. Lipoma right arm.   POSTOPERATIVE DIAGNOSES:  1. Cholelithiasis and cholecystitis.  2. Lipoma right arm.   SURGICAL PROCEDURES:  1. Laparoscopic cholecystectomy with intraoperative cholangiogram.  2. Excision 3-cm lipoma right arm.   SURGEON:  Lorne Skeens. Hoxworth, M.D.   ANESTHESIA:  General.   BRIEF HISTORY:  [name] is a 65 year old male who presents with gradually  worsening episodic right upper quadrant discomfort.  Workup has included a  gallbladder ultrasound indicating stones and thickening of the gallbladder  wall.  He is felt to have symptomatic cholelithiasis and cholecystitis.  Laparoscopic cholecystectomy with cholangiogram has been recommended and  accepted.  In addition, he has an enlarging uncomfortable 3-cm subcutaneous  mass just above the right antecubital fossa consistent with lipoma.  We have  recommended proceeding with laparoscopic cholecystectomy with cholangiogram  and excision of his symptomatic subcutaneous mass.  The nature of the  procedures, indications, risks of bleeding, infection, bile leak, bile duct  injury and cardiorespiratory complications have been discussed and  understood.  He is now brought to the operating room for this procedure.   DESCRIPTION OF PROCEDURE:  The patient was brought to the operating room,  placed in supine position on the operating table and general endotracheal  anesthesia was induced.  The abdomen was widely sterilely prepped and  draped.  Correct patient and procedure were verified.  He  received  preoperative antibiotics.  Local anesthesia was used to infiltrate the  trocar sites prior to the incision.  A 1-cm incision made above the  umbilicus and dissection carried down to midline fascia which was sharply  incised for 1 cm.  The peritoneum was entered under direct vision.  Through  a mattress suture of 0 Vicryl, the Hasson trocar was placed and  pneumoperitoneum established.  Under direct vision a 10-mm trocar was placed  in the subxiphoid area and two 5-mm trocars along the right subcostal  margin.  The gallbladder was visualized.  It was mildly thickened and there  were some omental adhesions.  These were carefully taken down with cautery  dissection and the fundus grasped and elevated up over the liver.  The  infundibulum was exposed with further dissection and retracted  inferolaterally.  Peritoneum anterior and posterior to Calot's triangle was  incised.  Fibrofatty tissue was stripped off the neck of the gallbladder  toward the porta hepatis.  The distal gallbladder was thoroughly dissected  and the cystic duct identified and the cystic duct/gallbladder junction  dissected 360 degrees.  When the anatomy appeared clear, the cystic duct was  clipped at the gallbladder junction and operative cholangiograms were  obtained through  the cystic duct.  This showed good filling of normal common  bile duct and intrahepatic ducts with free flow into the duodenum and no  filling defects.  Following this, the cholangiocatheter was removed, and the  cystic duct was doubly clipped proximally divided.  The cystic artery was  clearly defined in Calot's triangle and was doubly clipped proximally,  clipped distally and divided.  The gallbladder was then dissected free from  its bed using hook cautery.  It was removed intact through the umbilicus.  Complete hemostasis in the gallbladder bed with cautery and an additional  Surgicel pack was placed for added hemostasis as the patient was  currently  on Plavix.  Trocars were removed under direct vision and all CO2 evacuated.  The mattress suture was secured at the umbilicus and an additional 0 Vicryl  endo-tie was placed.  Skin was closed with subcuticular 4-0 Monocryl and  Steri-Strips.   Following this, the right arm was sterilely prepped and draped.  A  transverse incision was made directly over the mass and dissection carried  down through the subcutaneous tissue.  A typical-appearing encapsulated  lipoma was bluntly dissected free from the deep tissues and excised  completely.  The wound was closed with subcuticular 4-0 Monocryl and Steri-  Strips.  Sponge, needle and instrument counts were correct.  The patient was  taken to the recovery room in good condition.      Lorne Skeens. Hoxworth, M.D.  Electronically Signed     BTH/MEDQ  D:  03/21/2006  T:  03/22/2006  Job:  [id number]

## 2010-11-03 NOTE — Cardiovascular Report (Signed)
[name], [name]                  ACCOUNT NO.:  [id number]   MEDICAL RECORD NO.:  [id number]          PATIENT TYPE:  INP   LOCATION:  6533                         FACILITY:  [facility]   PHYSICIAN:  Salvadore Farber, M.D. LHCDATE OF BIRTH:  [birth year]   DATE OF PROCEDURE:  07/21/2004  DATE OF DISCHARGE:                              CARDIAC CATHETERIZATION   PROCEDURE:  Coronary and saphenous vein graft angiography, LIMA and renal  angiography, drug-eluting stent placement to the native LAD via the left  internal mammary artery graft.   INDICATIONS:  [name] is a 65 year old gentleman status post coronary  artery bypass grafting. He is status post subsequent drug-eluting stent  placement in the midcircumflex by Dr. Juanda Chance. He now presents with unstable  angina. He is referred for diagnostic angiography and possible percutaneous  coronary intervention.   TECHNIQUE:  Informed consent was obtained.  Under 1% lidocaine local  anesthesia, a 5-French sheath placed in right common femoral arteries  modified Seldinger technique. Diagnostic angiography of the native system  was performed using JL-4 and JR-4 catheters. Both saphenous vein grafts were  selectively engaged using a JR-4 catheter. The JR-4 catheter was then pulled  back and manipulated into the innominate artery. Wire was advanced into the  subclavian. It was then selectively engaged the right internal mammary  artery using a no torque right catheter. Angiography was performed by hand  injection. LIMA catheter was then advanced over wire and used to perform  left subclavian angiography. I was unable to manipulate this into the left  internal mammary artery. The LIMA has an usual origin from the subclavian.   In order to engage the left subclavian, I began anticoagulation with 4000  units of heparin. I then advanced JR-4 diagnostic catheter over into the  left subclavian. I advanced a BMW wire to the proximal portion of the LIMA.  Over this, I was able to selectively engage the renal diagnostic catheter.  Angiography of the LIMA graft was then performed by hand injection. This  demonstrated a 90% stenosis of the mid LAD just distal to the touchdown of  the LIMA graft. Decision was made to proceed to percutaneous  revascularization.   Additional heparin was given to achieve and maintain ACT greater than 200  seconds. Double bolus eptifibatide was administered. The JR-4 sheath was  upsized to 6-French. JR-4 catheter was used to engage the left subclavian.  This was exchanged for a 6-French multipurpose guide over a wire. I then  wired the LIMA using BMW wire. Over this support, I was able to selectively  engage the multipurpose guide into the LIMA. Then advanced the wire through  the LIMA into the distal portion of the LAD. I then directly stented the LAD  lesion unit using a 2.5 x 13 mm Cypher deploying it at 16 atmospheres. I  then postdilated the stent using a 2.5 x 12 mm Quantum at 16 atmospheres.  Final angiography demonstrated no residual stenosis and TIMI III flow to the  distal vasculature. The patient tolerated the procedure well and was  transferred to the  holding room in stable condition.  Then 300 mg of Plavix  was administered prior to transfer.   COMPLICATIONS:  None.   FINDINGS:  1.  Left main: Angiographically normal.  2.  LAD: Large vessel wrapping the apex of the heart and giving rise to      three diagonals.  3.  The LIMA to the LAD is widely patent. There was a 70% stenosis distal to      the to the second diagonal and a 90% stenosis distal to the touchdown      the LIMA. The 90% stenosis was treated with drug-eluting stent. The      saphenous vein graft to the first diagonal was widely patent. The LIMA      to the LAD is widely patent. The saphenous vein graft to the third      diagonal is previously been known to be occluded and remained so. The      sequential portion of the graft extending  from the graft to the first      diagonal to the PLA is occluded.  4.  Circumflex: Moderate size vessel giving rise to obtuse marginals. There      is a 30% stenosis proximally. There is a stent in mid vessel that is      widely patent with excellent runoff.  5.  RCA: Large, dominant vessel. The proximal and mid vessel are severely      and diffusely diseased with up to 95% stenosis. The RIMA to the PDA is      widely patent with excellent distal runoff.   IMPRESSION/RECOMMENDATIONS:  1.  Patent left internal mammary artery to left anterior descending artery.      There was a left anterior descending artery lesion downstream of the      touchdown which was treated with drug-eluting stent with excellent      result. The patient should be continued on Plavix for a minimum of one      year. Strong consideration should be given to lifelong Plavix given his      heavy burden of disease.  2.  Widely patent right internal mammary artery to posterior descending      artery, patent saphenous vein graft to first diagonal. The sequential      graft to the posterolateral artery is occluded.  3.  Occluded saphenous vein graft to third diagonal. down MD      WED/MEDQ  D:  07/21/2004  T:  07/22/2004  Job:  [id number]   cc:   Learta Codding, M.D. [facility] M. Kriste Basque, M.D. [facility]

## 2010-11-03 NOTE — Discharge Summary (Signed)
[name], [name] NO.:  [id number]   MEDICAL RECORD NO.:  [id number]          PATIENT TYPE:  INP   LOCATION:  2015                         FACILITY:  [facility]   PHYSICIAN:  Upsala Bing, M.D.  DATE OF BIRTH:  17-Apr-1946   DATE OF ADMISSION:  07/20/2004  DATE OF DISCHARGE:  07/23/2004                           DISCHARGE SUMMARY - REFERRING   DISCHARGING PHYSICIAN:  Olga Millers, M.D.   SUMMARY OF HISTORY:  [name] is a 65 year old white male who presented  with chest discomfort.  He was awakened on the admission with nausea and a  general sense of malaise.  He checked his blood pressure and felt that this  was more elevated than usual and did note some dyspnea and lightheadedness.  He called the office to report these symptoms, and he was advised to be  transported to the emergency room by EMS.  He continued to have discomfort  on his arrival which was relieved with sublingual and IV nitroglycerin.  However, his discomfort returned and seemed to wax and wane.   His history is notable for bypass surgery in September, 2002;  however, with  recurrent ischemia in June, 2003, he was found to have occluded graft to a  third diagonal and the PL branch of the circumflex.  He underwent stenting  at that time in the native circumflex with relief of symptoms.  His history  is also notable for obesity, hypertension, hyperlipidemia, sleep apnea,  GERD, and gout.   LABORATORY DATA:  Chest x-ray showed cardiomegaly with mild edema.  Admission weight was 237.8.  Admission H&H was 14.6 and 44.9, platelets  205,000, WBC 8.  PT 13.4, PTT 47.  Sodium 140, potassium 3.8, BUN 21,  creatinine 1.5, magnesium 1.9.  Total CK-MB and relative index were negative  x4.  Troponins were negative x3.  Fasting lipids showed a total cholesterol  of 136, triglycerides 63, HDL 44, LDL 79.  TSH 2.108.  Subsequent  hematologies were unremarkable.  Prior to discharge, H&H was 15.5 and 45,  normal indices, platelets 194,000, WBC 9.5.  Last chemistry was on July 22, 2004.  This showed a sodium of 139, potassium 3.6, BUN 12, creatinine  1.2.  An EKG showed sinus bradycardia, normal axis, slightly delayed R wave,  nonspecific ST-T wave changes.   HOSPITAL COURSE:  [name] was placed on IV heparin and admitted to 3100.  With his ongoing discomfort, it was felt that he should undergo cardiac  catheterization.  Overnight, he ruled out for myocardial infarction and  catheterization was performed on 07/21/2004 by Dr. Samule Ohm.  According to his  progress note, he had native three-vessel coronary artery disease.  The  saphenous vein graft to the first diagonal was patent; however, the  sequential graft was occluded.  The ramus to the PDA was patent, and the  LIMA to the LAD was also patent;  however, there was a distal 90% stenosis  in the mid-LAD past the anastomosis.  Dr. Samule Ohm, after review, performed  Cypher stenting to the mid-LAD without difficulty, reducing an 90% lesion to  0%.  He comments that the patient should be on Plavix for greater than or  equal to one year and aspirin indefinitely.  An echocardiogram post-  procedure was performed.  This revealed an EF of 50-55% and was inadequate  for wall motion abnormalities, trivial AI, mild MAC, and mild left atrial  dilatation.  The patient remained on bed rest.  The catheterization site was  intact;  however, on the morning of 07/22/2004, the patient was complaining  of some right groin pain at the catheterization site.  Dr. Gerri Spore  examined him and felt that there was a pulsatile mass and a soft bruit.  Ultrasound was performed and showed a pseudoaneurysm.  This was compressed  without difficulty.  On the morning of the July 23, 2004, renal  ultrasound was performed, and the pseudoaneurysm remained thrombosed.  Thus,  his activity was increased, and he was discharged later that afternoon.   DISCHARGE DIAGNOSES:   1.  Hypertension urgency.  2.  Unstable angina.  3.  Coronary artery disease, status post Cypher stenting to the mid-left      anterior descending as previously described.  4.  History as previously described.   DISPOSITION:  The patient is discharged home.   NEW MEDICATIONS:  1.  Plavix 75 mg daily for one year.  2.  His clonidine was increased to 0.2 mg b.i.d.  3.  He was asked to continue on his aspirin 326 daily.  4.  Zantac 150 b.i.d.  5.  Pepcid 20 mg daily.  6.  HCTZ 12.5 daily.  7.  Zocor 80 mg daily.  8.  Labetalol 400 mg b.i.d.  9.  Fish oil as previously.  10. Allopurinol 100 mg daily.  11. Nitroglycerin 0.4 as needed.  12. Lisinopril 40 daily.   He was advised no lifting, driving, sexual activity, or heavy exertion for  three days, maintain a low-salt, fat, and cholesterol diet.  If he has  further problems with his catheterization site, he was asked to call us  immediately.  He was instructed that he may use over-the-counter Tylenol for  pain.  He was asked to call our office on Monday to arrange a two-week  follow-up appointment with Dr. Andee Lineman.      EW/MEDQ  D:  07/23/2004  T:  07/23/2004  Job:  [id number]   cc:   Lonzo Cloud. Kriste Basque, M.D. [facility]   Learta Codding, M.D. [facility]

## 2010-11-03 NOTE — Consult Note (Signed)
[place]. [facility]  Patient:    SAEL, FURCHES Visit Number: [id number] MRN: [id number]          Service Type: CAT Location: [id number] Attending Physician:  Veneda Melter Dictated by:   Salvatore Decent. Cornelius Moras, M.D. Proc. Date: 03/04/01 Admit Date:  03/04/2001   CC:         Lewayne Bunting, M.D. [facility]  Veneda Melter, M.D.  Lonzo Cloud. Kriste Basque, M.D. [facility]  Barbaraann Share, M.D. [facility]   Consultation Report  REQUESTING PHYSICIAN:  Veneda Melter, M.D.  PRIMARY CARDIOLOGIST:  Lewayne Bunting, M.D.  PRIMARY CARE PHYSICIAN:  Lonzo Cloud. Kriste Basque, M.D.  PULMONOLOGIST:  Barbaraann Share, M.D.  REASON FOR CONSULTATION:  Severe three-vessel coronary artery disease with class 4 unstable angina.  HISTORY OF PRESENT ILLNESS:  Mr. Collard is a 65 year old white male from [place] with no previous cardiac history but risk factors including hypertension, hyperlipidemia, and previous history of tobacco use.  The patient had been in his usual state of good health until August 2002 at which time he was seen by Dr. Kriste Basque and noted to complain of some episodes of substernal chest pain.  He was referred to Dr. Andee Lineman who subsequently scheduled him for elective cardiac catheterization today.  Mr. Antero reports symptoms of substernal pressure-like chest pain which is typically left-sided and radiates through to his back.  This has typically been brought on by stressful physical exertion or meals and relieved by rest.  However, frequently he has had these episodes recently with no physical exertion or at rest.  He had some episode of chest pain immediately following catheterization today.  Pain seems to be relieved by sublingual nitroglycerin.  Cardiac catheterization today performed by Dr. Chales Abrahams revealed severe three-vessel coronary artery disease with normal left ventricular function.  Mr. Chizmar is referred for possible elective coronary artery bypass grafting.  REVIEW OF SYSTEMS:  Notable for the  following categories and associate data: CARDIAC:  The patient denies any episodes of resting shortness of breath.  He has only mild exertional shortness of breath.  He has had no problems with orthopnea, PND, or nocturnal angina.  He has had mild lower extremity edema in the past and he has had some episodes of palpitations.  He denies any episodes of syncope or near-syncopal episodes.  GENERAL:  The patient has otherwise been well and denies any problems with increasing fatigue or recent weight gain or weight loss.  RESPIRATORY:  Notable for the absence of any recent acute symptoms including no productive cough, hemoptysis, or wheezing.  The patient has history of obstructive sleep apnea and uses a BiPAP machine at home at night with good results.  He has been evaluated by Dr. Shelle Iron for this in the past.  GI:  Notable for problems with solid food dysphagia which has been recent over the last three or four months.  He states that he has had problems with severe and unrelenting heart burn on frequent occasions and he notes that solid food occasionally will get stuck or feel like it nearly gets stuck going down if he does not chew it completely.  He states that this feels as though it is down low in his chest at the distal end of the esophagus, as food does not seem to get stuck early on swallowing.  He denies any dysphagia if he chews his food completely and he has never had any problems with food getting completely stuck or obstructing his esophagus.  He  has no odynophagia.  He denies any episodes of hematemesis, hematochezia, or melena.  NEUROLOGIC: Negative including no symptoms which would be suggestive of TIA or amaurosis fugax.  MUSCULOSKELETAL:  Negative with the exception of the fact that both arms tend to fall asleep when he is lying supine, particularly right side greater than left.  He reports no problems with arthritis or other arthralgias.  GU:  Notable for remote history of  gross hematuria more than one year ago.  He has not had any recent symptoms of hematuria, dysuria, or urinary hesitancy or frequency.  INFECTIOUS:  Negative, including no fevers or chills.  HEMATOLOGIC:  Negative with no bleeding diathesis, frequent epistaxis, or easy bruising.  ENDOCRINE:  Negative.  PSYCHIATRIC:  Notable for post traumatic stress disorder although he has not had any recent problems. PERIPHERAL VASCULAR:  Negative with no symptoms suggestive of claudication.  PAST MEDICAL HISTORY:  Notable for hypertension, hyperlipidemia, obstructive sleep apnea.  The patient has history of ruptured left eardrum which occurred during his tour of duty in [place].  He is followed at the [facility].  He has surgery on his left ear at that time in 1969. The patient denies any known history of diabetes or previous stroke.  FAMILY HISTORY:  Notable in that both of his parents suffered strokes.  There is no history of early-onset coronary artery disease.  SOCIAL HISTORY:  The patient is divorced and lives alone.  He has one daughter who lives nearby and his ex-wife lives nearby and is somewhat supportive.  He has history of tobacco use although he quit smoking two weeks ago.  He reports smoking approximately one-half pack of cigarettes per day off and on for 15-20 years.  He denies any history of excessive alcohol consumption recently.  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  MEDICATIONS PRIOR TO ADMISSION: 1. Labetalol 200 mg p.o. twice daily. 2. Accupril 40 mg p.o. once daily. 3. Nasonex spray 50 mcg daily. 4. Aspirin 325 mg daily. 5. Imdur 30 mg once daily. 6. Maxzide 25 mg once daily.   ALLERGIES:  The patient denies any known drug allergies or sensitivities.  PHYSICAL EXAMINATION:  GENERAL:  Well-appearing white male who is moderately obese but appears  his stated age in no acute distress.  VITAL SIGNS:  He is 5 feet 9 inches tall and weighs approximately 230 pounds. Blood pressure 130/90, pulse 54 and regular.  He is in normal sinus rhythm by telemetry monitor.  APPEARANCE:  His general appearance is well and his skin is clean and dry and healthy-appearing throughout.  HEENT:  Exam essentially within normal limits.  NECK:  Supple.  There is no cervical or supraclavicular lymphadenopathy. There is no jugular venous distention.  No carotid bruits are noted.  RESPIRATORY:  Auscultation of the chest reveals clear and symmetrical breath sounds bilaterally.  No wheezes or rhonchi are noted.  CARDIOVASCULAR:  Demonstrates regular rate and rhythm.  No murmurs, rubs, or gallops are noted.  ABDOMEN:  Mildly obese, soft, and nontender.  The liver edge is nonpalpable. No masses are identified.  EXTREMITIES:  Warm and well-perfused.  Distal pulses are palpable in all four extremities.  There is a normal Allen test on the left side.  There is no lower extremity edema.  There is no venous insufficiency.  GENITOURINARY:  Deferred.  NEUROLOGIC:  Grossly nonfocal and symmetrical throughout.  The remainder of  his physical exam is noncontributory.  DIAGNOSTIC TESTS:  Cardiac catheterization film performed today by Dr. Chales Abrahams is reviewed.  This demonstrated severe three-vessel coronary artery disease with normal left ventricular function.  Specifically, there is 70% proximal stenosis and 70-80% stenosis of the mid left anterior descending coronary artery.  There is 90% proximal stenosis of the small third diagonal branch. There is a very large first diagonal branch which courses over the anterolateral wall like a ramus intermediate branch.  It is difficult to visualized on catheterization.  There may be a 20-30% proximal stenosis. There is 70% stenosis of the distal left circumflex coronary artery arising before several posterolateral  branches.  There is codominant coronary circulation.  There is long segment 70% stenosis and 80-90% proximal stenosis of the right coronary artery.  Left ventricular function appears preserved with ejection fraction estimated at 55%.  There are no significant wall motion abnormalities.  IMPRESSION:  Severe three-vessel coronary artery disease with class 4 unstable angina and normal left ventricular function.  I believe that Mr. Rains would benefit from elective coronary artery bypass grafting.  PLAN:  We tentatively plan to proceed with surgery tomorrow, first case. Mr. Mcgeehan and his family understand and accept all associated risks of surgery including but not limited to risk of death, stroke, myocardial infarction, bleeding requiring blood transfusion, arrhythmia, infection, and recurrent coronary artery disease.  The relative risks and benefits of use of alternative arterial conduits for bypass grafting including possible right internal mammary artery and possible left radial artery have been discussed. All their questions have been addressed. Dictated by:   Salvatore Decent Cornelius Moras, M.D. Attending Physician:  Veneda Melter DD:  03/04/01 TD:  03/04/01 Job: [id number]

## 2010-11-03 NOTE — Assessment & Plan Note (Signed)
[facility]                            CARDIOLOGY OFFICE NOTE   OSHEA, PERCIVAL                         MRN:          [id number]  DATE:07/24/2006                            DOB:          03/11/1946    PRIMARY CARE PHYSICIAN:  Lonzo Cloud. Kriste Basque, M.D.   PATIENT IDENTIFICATION:  [name] is a very pleasant 65 year old male,  who presents today for routine followup.  He has a history of coronary  artery disease, status post bypass grafting in 2002, as well as stenting  to the circumflex and LAD with drug eluting stents.  Recent Myoview in  September of 2007 showed an EF of 62% with no significant ischemia.  He  presents today for routine followup.  He has been doing well.  He has  been walking a mile and a quarter a day without any chest pain or  shortness of breath.  His only real complaint is that, when he wakes up  in the morning, his left arm is numb and a little bit tingly, but then  it resolves.  He also has some achiness.  He saw Dr. Kriste Basque, who told him  this was likely to be arthritis.  Otherwise, he is doing well, no heart-  failure symptoms.  He has been compliant with his C-PAP.   INTERVAL HISTORY:  Please note that he was admitted in January for  atypical chest pain.  He ruled out for myocardial infarction and was  discharged home with medical therapy.   CURRENT MEDICATIONS:  1. Aspirin 81 a day.  2. Plavix 75 a day.  3. Labetalol 200 t.i.d.  4. Allopurinol 100 a day.  5. Hydrochlorothiazide 25 a day.  6. Norvasc 10 a day.  7. Lovastatin 40 a day.  8. Clonidine 0.1 t.i.d.  9. Lisinopril 40 a day.  10.Omega 3 fatty acids.  11.Pepcid.  12.Acyclovir.   PROBLEM LIST:  For complete problem list, please see my note of June of  2007.   PHYSICAL EXAM:  He is an obese male in no acute distress.  He ambulates  around the clinic without any respiratory difficulty.  Blood pressure is  128/78, heart rate is 51, his weight is 245.  HEENT:   Sclerae are anicteric.  EOMI.  He has no xanthelasmas.  Mucous  membranes are moist.  Oropharynx is clear.  NECK:  Supple.  There is no JVD.  Carotids are 2+ bilaterally without  any bruits.  There is no lymphadenopathy or thyromegaly.  CARDIAC:  He is bradycardic and regular.  No obvious murmurs or rubs.  There is a soft S4.  LUNGS:  Clear.  On his right upper chest wall, there is a large, pearly  papule.  ABDOMEN:  Obese, nontender, nondistended.  No appreciable  hepatosplenomegaly, no bruits, no masses.  Good bowel sounds.  EXTREMITIES:  Warm with no cyanosis, clubbing or edema.  NEUROLOGIC:  He is alert and oriented times three.  Cranial nerves II  through XII are intact.  Moves all four extremities without difficulty.   ASSESSMENT AND PLAN:  1.  Coronary artery disease:  Quite stable without any evidence of      recurrent ischemia.  He is on an excellent medical regimen and      continue this.  2. Hypertension:  Good control.  Continue current therapy.  3. Hyperlipidemia:  This is followed by Dr. Kriste Basque.  Would suggest      titrating his statin aggressively to keep his LDL under 100.  4. Obesity:  He has apparently gained approximately 15-20 pounds since      his last visit.  Once again, I reminded him to be aggressive with      his exercise and diet program.  5. Pearly papule on his chest:  This is concerning for possible skin      cancer.  I have asked him to follow up with dermatology.   He will return for routine followup in six months.     Bevelyn Buckles. Bensimhon, MD  Electronically Signed    DRB/MedQ  DD: 07/24/2006  DT: 07/24/2006  Job #: [id number]   cc:   Lonzo Cloud. Kriste Basque, MD

## 2010-11-03 NOTE — Cardiovascular Report (Signed)
[place]. [facility]  Patient:    [name], [name] Visit Number: [id number] MRN: [id number]          Service Type: CAT Location: 3700 [id number] Attending Physician:  Veneda Melter Dictated by:   Veneda Melter, M.D. Proc. Date: 03/04/01 Admit Date:  03/04/2001   CC:         Lewayne Bunting, M.D. [facility] M. Kriste Basque, M.D. [facility]   Cardiac Catheterization  PROCEDURES PERFORMED: 1. Left heart catheterization. 2. Left ventriculogram. 3. Abdominal aortogram. 4. Selective coronary angiography. 5. Selective angiography, left subclavian artery.  DIAGNOSES: 1. Three-vessel coronary artery disease. 2. Normal left ventricular systolic function.  INDICATIONS: The patient is a 65 year old, white male, with multiple cardiovascular risk factors, who presents with crescendo angina. The patient started developing chest discomfort with minimal activity. He was stabilized medically, and presents today for elective cardiac catheterization.  TECHNIQUE: After informed consent was obtained, the patient was brought to the cardiac catheterization lab where a 6 French sheath was placed in the right femoral artery.  Left heart catheterization and selective angiography were then performed in the usual fashion using preformed 6 French Judkins catheters.  A JR4 catheter was used to engage the left subclavian artery and nonselective opacification internal mammary artery performed and abdominal aortogram was also performed using power injection of contrast through the pigtail catheter.  At the termination of the case, the catheters and sheath were removed and manual pressure applied until adequate hemostasis was achieved. The patient tolerated the procedure well and was transferred to the ward in stable condition.  FINDINGS: Findings are as follows: 1. Left main trunk: The left main trunk has mild irregularities of 20%. 2. LAD: This is a medium caliber vessel that provides two large and  one    medium caliber diagonal branch in the proximal segment.  The LAD then    extends to the apex.  There is moderate disease of 50% between the first    and second diagonal branch with then severe diffuse disease of 70% between    the second and third diagonal branch.  There is high-grade narrowing of    70% in the distal LAD prior to the apex.  The first diagonal branch has an    ostial narrowing of 30%, second diagonal branch has mild irregularities,    the third diagonal branch has a high-grade narrowing of 70-80% in the    proximal segment. 3. Left circumflex artery: This is a medium caliber vessel that provides    a small first marginal branch in the mid section and two small distal    marginal branches.  There is moderate stenosis of 60-70% in the mid AV    circumflex after the first marginal branch. 4. Right coronary artery:  Right coronary is dominant.  This is a medium    caliber vessel that provides the posterior descending artery and a small    posterior ventricular branch in the terminal segment.  There is severe    diffuse disease in the proximal mid section of the right coronary artery    with narrowings of up to 80%.  LEFT VENTRICULOGRAM: Normal end-systolic and end-diastolic dimensions. Overall left ventricular function is well preserved, ejection fraction of greater than 55%.  No mitral regurgitation.  LV pressure is 178/20, aortic is 178/100.  LVEDP equals 23.  ABDOMINAL AORTOGRAM: Abdominal aorta is of normal caliber with mild atheromatous buildup. The left renal artery is single and patent with mild irregularities.  The right kidney is supplied by two arteries which arise from the internal mesenteric artery. These vessels have mild irregularities not greater than 20-30%. The iliac arteries are patent with mild disease of 30%.  Left subclavian artery is patent with mild irregularities.  Internal mammary artery is of normal caliber and extends to the  diaphragm.  ASSESSMENT AND PLAN: The patient is a 65 year old, white male with advanced coronary artery disease and well preserved left ventricular function.  He has severe disease in the proximal and mid right coronary artery with a long segment of at least 45 mm and a vessel approximately 3.3 mm.  Unfortunately, due to the length of this lesion, the risk of re-stenosis is high. In addition, the left anterior descending lesion is of borderline significance. However, it encompass the second and third diagonal branches, and due to the acute takeoff of the third diagonal branch with a high-grade narrowing in the proximal segment, percutaneous intervention would be associated with risks of loss of this vessel.  The patient will thus be considered for surgical revascularization.  Alternatively percutaneous intervention to the right coronary artery, and medical therapy of the LAD may be pursued.  These will be discussed with the patient and my colleagues. Dictated by:   Veneda Melter, M.D. Attending Physician:  Veneda Melter DD:  03/04/01 TD:  03/04/01 Job: [id number]

## 2010-11-03 NOTE — Discharge Summary (Signed)
[place]. [facility]  Patient:    MALVERN, KADLEC Visit Number: [id number] MRN: [id number]          Service Type: MED Location: [phone] 01 Attending Physician:  Tressie Stalker Dictated by:   Lissa Merlin, P.A. Admit Date:  03/04/2001 Discharge Date: 03/09/2001   CC:         CVTS Office  Lewayne Bunting, M.D. [facility]  Scott M. Kriste Basque, M.D. [facility]   Discharge Summary  DATE OF BIRTH:  [birth year]  SURGEON:  Salvatore Decent. Cornelius Moras, M.D.  CARDIOLOGY:  Lewayne Bunting, M.D.  PRIMARY CARE PHYSICIAN:  Lonzo Cloud. Kriste Basque, M.D.  ADMITTING DIAGNOSIS:  Chest pain.  DISCHARGE DIAGNOSES: 1. Unstable angina. 2. Severe three-vessel coronary artery disease with normal left ventricular    function and ejection fraction of 65%.  PAST MEDICAL HISTORY:  No previous history of coronary artery disease. 1. Hypertension. 2. Hyperlipidemia. 3. Smoking. 4. Obstructive sleep apnea with CPAP. 5. Gastroesophageal reflux disease.  PROCEDURES: 1. Cardiac catheterization, March 04, 2001 2. Coronary artery bypass graft, March 05, 2001, with the following    grafts:  Left internal mammary artery to the distal LAD, right internal    mammary artery to distal RCA, sequential saphenous vein graft from first    diagonal to left PL, saphenous vein graft to third diagonal. 3. Pre-coronary artery bypass graft Doppler on March 04, 2001, were    normal.  HISTORY OF PRESENT ILLNESS:  Mr. Krasowski is a 65 year old male with no previous cardiac history.  Risk factors including hypertension, hyperlipidemia, and smoking.  He was in his usual state of health until August of 2002 at which time he was seen by Dr. Kriste Basque with regard to substernal chest pain.  He was referred to Lewayne Bunting, M.D. who scheduled him for elective cardiac catheterization.  HOSPITAL COURSE:  He came into the hospital for the cardiac catheterization which he had on March 04, 2001, that showed severe three-vessel coronary artery  disease.  Dr. Cornelius Moras was consulted and after reviewing the catheterization data and examining Mr. Rodelo coronary artery bypass graft was recommended.  Risks, benefits, details, and alternatives were discussed and agreed to proceed.  Mr. Kimberley underwent surgery on March 05, 2001.  There were no complications.  He was taken to [facility] in stable condition.  There were no inotropic agents used and he was off bypass with normal sinus rhythm. Postoperative Mr. Percival had an uncomplicated course.  He made fairly quick progress and was transferred to unit 2000 on postoperative day one where routine care was initiated.  The only matter of significant was the fact that Mr. Willcutt was dependent on O2 up until shortly before discharge.  This is not surprising in light of his heavy smoking history.  By postoperative day four Mr. Gambale was doing very well.  He was in sinus rhythm and vital signs were stable.  His room air saturation was 91% after walking.  He was below his postoperative weight, physical examination was satisfactory, and wounds were healing well.  He was neurologically intact, and he was suitable for discharge home and was discharged.  DISCHARGE MEDICATIONS: 1. Enteric coated aspirin 325 mg 1 p.o. q.d. 2. Lopressor 25 mg p.o. q.12h. 3. Lasix 40 mg 1 p.o. q.d. for seven days. 4. KCL 20 mEq 1 p.o. q.d. x 7 days. 5. Combivent inhaler 2 puffs 4 x q.d. 6. Accupril 40 mg 1 p.o. q.d. 7. Pepcid 20 mg 1 p.o. q.h.s. 8. Percocet 5/325 1-2 q.4-6h.  as needed for severe pain. 9. Nasonex as before admission.  ALLERGIES:  No known drug allergies.  CONDITION:  Stable.  SPECIAL INSTRUCTIONS:  He was told to do no driving, no strenuous activity, no heavy lifting, walk daily, and he could shower.  Clean wound gently daily with soap and water and to be alert for increasing redness, swelling, drainage, or fever.  He was told to get a chest x-ray when he sees his cardiologist in two weeks and to bring  it with him to see Dr. Cornelius Moras.  FOLLOWUP: 1. Lewayne Bunting, M.D. two weeks after discharge. 2. Salvatore Decent. Cornelius Moras, M.D. three weeks after discharge. Dictated by:   Lissa Merlin, P.A. Attending Physician:  Tressie Stalker DD:  03/26/01 TD:  03/26/01 Job: [id number]

## 2010-11-03 NOTE — Cardiovascular Report (Signed)
[place]. [facility]  Patient:    [name], [name] Visit Number: [id number] MRN: [id number]          Service Type: MED Location: [phone] Attending Physician:  Lenoria Farrier Dictated by:   Everardo Beals Juanda Chance, M.D. [facility] Proc. Date: 11/20/01 Admit Date:  11/18/2001 Discharge Date: 11/21/2001   CC:         Lorin Picket M. Kriste Basque, M.D. [facility]  Lewayne Bunting, M.D. [facility]  Cardiopulmonary Laboratory   Cardiac Catheterization  PROCEDURES PERFORMED: Cardiac catheterization and percutaneous coronary intervention.  CLINICAL HISTORY: The patient is 65 years old and had bypass surgery nine months ago. He was admitted with chest pain suggestive of angina and had negative troponins.  DESCRIPTION OF PROCEDURE: The procedure was performed via the right femoral artery using an arterial sheath and 6 French preformed coronary catheters.  A front wall arterial puncture was performed and Omnipaque contrast was used.  A LIMA catheter was used for injection of a LIMA graft. A No Torque catheter was used for nonselectively injection of the RIMA graft. The JR4 catheters were used for injection of the vein grafts.  After completion of the diagnostic study we made a decision to proceed with intervention on the native circumflex artery. The patient was given Angiomax bolus in infusion. We used a 3.5, 7 [place], Voda guiding catheter with side holes and a short floppy wire. We crossed the lesion in the mid circumflex artery with the wire without difficulty. We direct stented with a 2.5 x 23 mm Cypher stent deploying this with one inflation of 15 atmospheres for 40 seconds. Repeat diagnostic studies were then performed through the guiding catheter. The patient tolerated the procedure well and left the laboratory in satisfactory condition.  RESULTS: The left main coronary artery: The left main coronary artery was free of significant disease.  Left anterior descending: The left anterior  descending artery gave rise to a large optional diagonal branch, two septal perforators, a second moderate sized diagonal branch, a septal perforator that had competing flow distally. There was 80% narrowing in the proximal LAD after the second diagonal branch. There was 80% ostial stenosis of the first large diagonal branch.  Circumflex artery: The circumflex artery gave rise to a marginal branch, an atrial branch, and two posterolateral branches. There was 90% stenosis in the mid circumflex artery after the marginal and atrial branch.  Right coronary artery: The right coronary was a moderate sized vessel that gave rise to a conus branch and then had two tandem 90% stenosis with TIMI-1 distal past this point.  The saphenous vein graft to the optional diagonal branch and the posterolateral branch of the circumflex artery was patent in its proximal portion but occluded in its distal limb.  The saphenous vein graft to the third diagonal branch of the LAD was completely occluded. The distal vessel was not visualized.  The LIMA graft to the LAD was patent and functioned normally.  The RIMA graft to the posterior descending branch in the right coronary artery was patent and functioned normally.  LEFT VENTRICULOGRAPHY: The left ventriculogram was performed in the RAO projection showed good wall motion with no areas of hypokinesis.  Following stenting of the mid circumflex lesion, the stenosis improved from 90% to less than 10%.  The aortic pressure was 167/94 with a mean of 124 and left ventricular pressure 157/40.  CONCLUSIONS: 1. Status post prior coronary artery bypass surgery September 2002. 2. Severe native vessel disease with 80% stenosis  in the proximal left    anterior descending, 80% ostial stenosis in the first diagonal branch,    90% stenosis in the mid circumflex artery, and functional total occlusion    of the right coronary artery. 3. Patent left internal mammary  artery graft to the left anterior descending,    patent right internal mammary artery graft to the distal right coronary    artery, occluded vein graft to the third diagonal branch in the left    anterior descending, and a sequential vein graft to the optional diagonal    branch of the left anterior descending and posterolateral branch of the    circumflex artery, which is patent in its proximal portion but occluded in    its distal limb. 4. Normal left ventricular function. 5. Successful stenting of the native mid circumflex artery with improvement in    percent diameter narrowing from 90% to less than 10%.  DISPOSITION: The patient was returned to the postangioplasty unit for further observation. Dictated by:   Everardo Beals Juanda Chance, M.D. [facility] Attending Physician:  Lenoria Farrier DD:  11/20/01 TD:  11/22/01 Job: [id number]

## 2010-11-03 NOTE — Assessment & Plan Note (Signed)
[facility]                           GASTROENTEROLOGY OFFICE NOTE   Henry, Green                    MRN:          [id number]  DATE:02/26/2006                            DOB:          1945/11/04    This nice patient comes in after recent hospitalization for his heart.  He  comes in complaining of belching and pressure and burning sensation under  his left breast/epigastric region.  All foods seem to irritate him.  I had a  long talk with him because he was diagnosed to have cholelithiasis and  possibly some cholecystitis almost a year and a half previous and I guess  because of his illnesses and so on, he just has not pursued this to any  degree.  I thought he had already had surgery by now, but he is scheduled  for the cholecystectomy with Dr. Johna Sheriff on March 21, 2006 at [facility].  All of his symptoms that he gave me, and we spent a lot of time  discussing them, are probably related to his gallbladder.  He thought it  might be due to his GERD, but has taken Protonix and it really did not do  him any good.  He stated the Protonix seemed to give him more side-effects  than he really liked anyway.   MEDICINES:  He is presently on Plavix, fish oil, allopurinol, lisinopril,  baby aspirin, Lovastatin, clonidine, labetalol, Dovonex,  hydrochlorothiazide, multivitamin and Norvasc, along with some  nitroglycerin, which he said sometimes helps his stress too.   PHYSICAL EXAMINATION:  VITAL SIGNS:  He was 226.  Blood pressure 112/66,  pulse 60 and regular.  HEENT: EOMI. PERRLA. Sclerae are anicteric.  Conjunctivae are pink.  NECK:  Unremarkable.  CHEST:  Clear to auscultation and percussion without adventitious sounds.  CARDIAC:  Unremarkable.  ABDOMEN:  Bowel sounds are normoactive.  Abdomen is soft, non-tender and non-  distended.  There are no abdominal masses, tenderness, splenic enlargement  or hepatomegaly.  EXTREMITIES:   Unremarkable.  RECTAL:  There are no masses.  Stool is Hemoccult-negative.   IMPRESSION:  1. Abdominal pain, probably related to cholelithiasis and some      malfunctioning gallbladder with cholecystitis.  2. Hypercholesterolemia.  3. Hypertension.  4. Coronary artery disease, status post bypass grafting and stent.  5. Hypertension.  6. Gastroesophageal reflux disease.  7. Gout.   RECOMMENDATION:  Recommendation that he get his gallbladder out and  hopefully much of his symptomatology will improve.  In the meantime, he is  to be on a very low-fat diet.  I told him  that I really insisted on this for him.  He needs to lose some weight and I  gave him some Zegerid to take b.i.d.                                   Ulyess Mort, MD   SML/MedQ  DD:  02/26/2006  DT:  02/27/2006  Job #:  [id number]

## 2010-11-03 NOTE — Consult Note (Signed)
[place]. [facility]  Patient:    Green, Henry Visit Number: [id number] MRN: [id number]          Service Type: MED Location: [phone] Attending Physician:  [name] Dictated by:   [name], M.D. [facility] Proc. Date: 11/18/01 Admit Date:  11/18/2001   CC:         [name], M.D. [facility]   Consultation Report  REFERRING PHYSICIAN:  Lonzo Green. [name], M.D. [facility]  HISTORY OF PRESENT ILLNESS:  A 65 year old gentleman with CABG surgery nine months ago now presents with recurrent chest pain.  [name] describes a number of symptoms occurring over the course of the day.  Earlier he noted some vertigo, perhaps orthostatic.  This subsequently resolved with rest, but then he developed diaphoresis associated with substernal chest discomfort. These symptoms occurred intermittently prompting his appearance in the emergency department.  Blood pressure at home was reportedly 260/180.  In the ED, he experienced an episode of severe diaphoresis with nausea and dyspnea but without much in the way of chest discomfort.  There is a history of marijuana use earlier today; he denies cocaine for other drugs of abuse.  MEDICATIONS ON ADMISSION: 1. Metoprolol 50 mg b.i.d. 2. Aspirin 325 mg q.d. 3. Ranitidine 150 mg q.d. 4. HCTZ 25 mg q.d. 5. Lisinopril 40 mg q.d.  PAST MEDICAL HISTORY:  Notable for hyperlipidemia and hypertension.  SOCIAL HISTORY:  He lives in [place], divorced, has one daughter.  The patient is unemployed and disabled.  There is a history of tobacco use until the time of his bypass surgery.  He denies significant alcohol use.  FAMILY HISTORY:  Mother and father both had cerebrovascular disease and suffered CVAs.  REVIEW OF SYSTEMS:  Negative except as noted above.  PHYSICAL EXAMINATION:  GENERAL:  The patient appears stated age with flat affect, in no acute distress.  VITAL SIGNS:  Afebrile.  Heart rate 60 and regular,  respirations 20, blood pressure 140/70.  O2 saturation on room air 99%.  SKIN:  No significant lesions.  HEENT:  Anicteric sclerae.  NECK:  No jugular venous distension, no carotid bruits.  ENDOCRINE:  No thyromegaly.  HEMATOPOIETIC:  No adenopathy.  LUNGS:  Expiratory rhonchi.  CARDIAC:  Normal first and second heart sounds; no significant murmur nor gallop.  ABDOMEN:  Soft and nontender; no organomegaly.  EXTREMITIES:  Distal pulses intact; no edema.  NEUROMUSCULAR:  Symmetric strength and tone.  DIAGNOSTIC DATA:  EKG: Normal sinus rhythm; left atrial abnormality; there is slightly delayed R wave progression with lateral ST segment depression and T wave inversion which is new since his post-bypass tracing.  Laboratory otherwise unremarkable including normal CPK, CPK-MB, and troponin.  IMPRESSION:  An unfortunate gentleman with significant symptoms nine months following CABG surgery.  While his chest discomfort sounds to be ischemic, the etiology of his other symptoms is obscure.  The change in his electrocardiogram is also worrisome.  We will proceed with initial therapy with low molecular weight heparin, aspirin, beta blocker, and intravenous nitroglycerin.  Serial cardiac markers and EKGs to be obtained. Echocardiogram scheduled for the a.m.  He will probably require cardiac catheterization, but this test cannot be done tomorrow. Dictated by:   [name], M.D. [facility] Attending Physician:  [name] DD:  11/18/01 TD:  11/20/01 Job: [id number]

## 2010-11-03 NOTE — Assessment & Plan Note (Signed)
[facility]                                   ON-CALL NOTE   [name], [name]                           MRN:          [id number]  DATE:02/18/2006                            DOB:          01/13/1946    TELEPHONE CONSULTATION:  [name] called me today, indicating his blood  pressure is 210/111, he is having some nausea.  He is on labetalol 200 mg  three times a day, lisinopril 40 mg daily, HCTZ 12.5 mg daily, and clonidine  0.1 mg three times daily.  We instructed him to increase his clonidine to  0.3 mg three times daily and to come and see Dr. Kriste Basque this week in the  office for a recheck.  If he is worse, he is to go to the emergency room.                                   Charlcie Cradle Delford Field, MD, [facility]   PEW/MedQ  DD:  02/18/2006  DT:  02/18/2006  Job #:  [id number]

## 2010-11-03 NOTE — Discharge Summary (Signed)
[name], [name] NO.:  [id number]   MEDICAL RECORD NO.:  [id number]          PATIENT TYPE:  EMS   LOCATION:  MAJO                         FACILITY:  [facility]   PHYSICIAN:  Madolyn Frieze. Jens Som, MD, FACCDATE OF BIRTH:  05/23/1946   DATE OF ADMISSION:  07/01/2006  DATE OF DISCHARGE:                               DISCHARGE SUMMARY   PROCEDURES:  None.   TIME OF DISCHARGE:  23 minutes.   PRIMARY DIAGNOSIS:  Chest pain, cardiac enzymes negative for myocardial  infarction, and followup as an outpatient with Dr. Gala Romney.   SECONDARY DIAGNOSES:  1. Status post aortocoronary bypass surgery in 2002 with left internal      mammary artery to left anterior descending, right internal mammary      artery to posterior descending artery, saphenous vein graft to D1,      and sequential to PLA, saphenous vein graft to D3 (status post      cardiac catheterization February 2006 with saphenous vein graft to      D3 totaled and sequential portion of the graft from D1 to PLA      totaled).  2. Preserved left ventricular function with an ejection fraction of      62% at [facility] in September 2007.  3. Hypertension.  4. Hypercholesterolemia.  5. Obstructive sleep apnea on CPAP.  6. Obesity.  7. Gastroesophageal reflux disease.  8. Gout.  9. Family history of coronary artery disease and cerebrovascular      accident.  10.Status post cholecystectomy.  11.Status post bare metal stent to the circumflex in 2003.  12.Status post drug-eluting stent to the left anterior descending in      February 2006.  13.Remote history of tobacco use.   HOSPITAL COURSE:  [name] is a 65 year old male with known coronary  artery disease.  Other than a recent viral upper respiratory infection  and increased nasal congestion and allergy symptoms, he had been in his  usual state of health until the day of admission.  He had eating and  then taken several medications including Claritin and  over-the-counter  cold medications which are safe for people with heart disease.  He had a  feeling of chills and numbness all over his body and some left-sided  chest pain.  He was not sure whether it was reflux or angina, so he came  to the emergency room where he was admitted for further evaluation and  treatment.   His cardiac enzymes were negative for MI.  His symptoms resolved.  His  chest x-ray showed no acute disease, and his other labs showed no  critical abnormalities except for a mildly elevated white count of 11.6.   On July 02, 2006, Mr. Hubbert remained symptoms free.  He was evaluated  by Dr. Jens Som and considered stable for discharge without patient  followup arranged.   DISCHARGE INSTRUCTIONS:  His activity level is to be increased  gradually.  He is to call our office for any further symptoms or come to  the emergency room.  He is to stick to a low-fat  and salt diet.  He has  a followup with Dr. Gala Romney in February and is to follow up with Dr.  Kriste Basque as well.   DISCHARGE MEDICATIONS:  1. Aspirin 81 mg a day.  2. Plavix 75 mg a day.  3. Labetalol 200 mg t.i.d.  4. Allopurinol 100 mg a day.  5. Hydrochlorothiazide 25 mg a day.  6. Norvasc 10 mg a day.  7. Lovastatin 40 mg q.h.s.  8. Clonidine 0.1 mg t.i.d.  9. Lisinopril 40 mg a day.  10.Omega-3 fish oil 1000 mg a day.  11.Protonix 40 mg b.i.d.  12.Multivitamin daily.  13.Sublingual nitroglycerin p.r.n.      Theodore Demark, PA-C      Madolyn Frieze. Jens Som, MD, [facility]  Electronically Signed    RB/MEDQ  D:  07/02/2006  T:  07/02/2006  Job:  [id number]   cc:   Lonzo Cloud. Kriste Basque, MD

## 2010-11-03 NOTE — H&P (Signed)
[name], [name] NO.:  [id number]   MEDICAL RECORD NO.:  [id number]          PATIENT TYPE:  EMS   LOCATION:  MAJO                         FACILITY:  [facility]   PHYSICIAN:  [name], M.D.  DATE OF BIRTH:  [birth year]   DATE OF ADMISSION:  07/20/2004  DATE OF DISCHARGE:                                HISTORY & PHYSICAL   PHYSICIANS:  1.  Scott M. Kriste Basque, M.D., primary care physician.  2.  Learta Codding, M.D., primary cardiologist.   HISTORY OF PRESENT ILLNESS:  A 65 year old gentleman with known coronary  disease presenting with chest pain consistent with angina.  [name]  cardiac history dates to September of 2002 when he underwent CABG surgery.  Unfortunately, he returned with recurrent ischemia in June 2003 and was  found to have occluded grafts to a third diagonal and the posterolateral  branch of the circumflex.  He underwent stenting of the native circumflex  with relief of symptoms.  He has done well since with rare episodes of chest  discomfort and has not used nitroglycerin for many months.   This morning, he awoke with a sensation of nausea and malaise.  He  subsequently noted left chest discomfort but could not determine whether  this was of cardiac or GI origin.  He noted that his blood pressure was more  elevated than usual.  There was some associated dyspnea and lightheadedness.  He called to report these symptoms and was advised to be transported to the  emergency department by EMS.   Upon arrival, he continued to have discomfort, which was relieved with  sublingual nitroglycerin and intravenous nitroglycerin.  The intensity of  his discomfort was mild to moderate.  It persisted for the entire day with  some waxing and waning.  He took additional Labetalol due to his elevation  in blood pressure, but did not use his nitroglycerin, which were old.   [name] has multiple vascular risk factors including hypertension,  hyperlipidemia,  and obesity.  Left ventricular function was normal when last  assessed in 2003.   PAST MEDICAL HISTORY:  Otherwise only notable for sleep apnea, GERD, and  gout.   CURRENT MEDICATIONS:  1.  Viagra p.r.n.  2.  Ranitidine 150 mg b.i.d.  3.  Clonidine 0.1 mg b.i.d.  4.  HCTZ 12.5 mg q.d.  5.  Aspirin 325 mg q.d.  6.  Simvastatin 80 mg q.h.s.  7.  Labetalol 400 mg b.i.d.  8.  Lisinopril 40 mg q.d.  9.  Allopurinol 100 mg q.d.  10. Omeprazole 20 mg q.d.  11. Fish oil.   ALLERGIES:  He reports no allergies to medication.   SOCIAL HISTORY:  Lives in [place].  Has one daughter.  He is  disabled.  He discontinued cigarette smoking in 2002.  He denied excessive  use of alcohol.   FAMILY HISTORY:  Notable for multiple members with coronary disease and/or  cerebrovascular accident.   REVIEW OF SYMPTOMS:  Notable for arthralgias, primarily of his hands and  intermittent heart burn.  He has occasional nausea.  All other systems  reviewed and are negative.   PHYSICAL EXAMINATION:  GENERAL:  Pleasant, overweight gentleman in no acute  distress.  VITAL SIGNS:  Temperature is 98.5, heart rate 50 and regular, respirations  18, blood pressure 165/95.  O2 saturation 99% on two liters.  HEENT:  Anicteric sclerae.  Normal lids and conjunctivae.  SKIN:  No significant lesions.  NECK:  Moderate jugular venous distention.  Normal carotid upstrokes without  bruits.  ENDOCRINE:  No thyromegaly.  HEMATOPOIETIC:  No cervical, inguinal, or axillary adenopathy.  LUNGS:  Coarse breath sounds at the bases.  Some prolongation of the  expiratory phase.  CARDIOVASCULAR:  Normal first and second heart sounds.  A fourth heart sound  present.  ABDOMEN:  Mildly distended.  Normal bowel sounds.  No bruits.  No  organomegaly.  No masses.  EXTREMITIES:  Bounding dorsalis pedis pulses, no edema.  MUSCULOSKELETAL:  Symmetric strength and tone.  No joint deformities.  NEUROLOGICAL:  Normal affect and  orientation.   LABORATORY DATA:  Chest x-ray shows cardiomegaly, mild edema.  EKG shows  sins bradycardia, rightward axis, otherwise unremarkable.   Other laboratory is notable for creatinine of 1.5, normal glucose, and  negative point of care markers.   IMPRESSION:  [name] is an unfortunate gentleman who presents with  recurrent chest discomfort three and a half years following coronary artery  bypass graft surgery, having already required an intervention for two or  four grafts occluded.  The grafts that were patent a few years ago are  likely to continue to be so.  Stenosis or occlusion of the stent is  unlikely.  If he has recurrent ischemia, native vessel progression of  disease is expected.  Initial treatment will include intravenous  nitroglycerin and heparin.  In the absence of electrocardiogram  abnormalities, continuing symptoms, or elevated markers, a IIb 3a drug is  not necessary.  Clopidogrel will be started as well with coronary  angiography and perhaps intervention anticipated in the morning.  Clonidine  will be increased for better blood pressure control.  He was advised that he  could not arbitrarily take extra labetalol without the potential for adverse  effects.  We will start a small dose of furosemide due to evidence for mild  congestive heart failure by exam and by chest x-ray.  A BNP level and TSH  are pending.  Lipid profile will be obtained. The rationale for cardiac  catheterization was explained to the patient who agrees to proceed.      RR/MEDQ  D:  07/20/2004  T:  07/20/2004  Job:  [id number]

## 2010-11-03 NOTE — H&P (Signed)
[name], BOVEY NO.:  [id number]   MEDICAL RECORD NO.:  [id number]          PATIENT TYPE:  EMS   LOCATION:  MAJO                         FACILITY:  [facility]   PHYSICIAN:  Bevelyn Buckles. Bensimhon, MDDATE OF BIRTH:  [birth year]   DATE OF ADMISSION:  02/18/2006  DATE OF DISCHARGE:                                HISTORY & PHYSICAL   CHIEF COMPLAINT:  Chest pain.   HISTORY OF PRESENT ILLNESS:  [name] is a 65 year old man with a history  of coronary artery disease, status post CABG in 2002 and stents to the  circumflex and LAD, who has had substernal chest pain for 3 days which has  worsened in intensity, that sometimes originates in the epigastrium and  radiates throughout the precordium.  He saw his primary care physician 3  days ago and was noted to be hypertensive, a chronic issue for him.  He  reports his pain occurs both at rest and on exertion and has been associated  with upset stomach.  The symptoms resemble those attributed to GERD in the  past, they also resemble prior anginal episodes.  He has had no syncope and  there has been no CHS symptoms, however, he has had dizziness which seems to  correlate with systolic blood pressures greater than 200.   ALLERGIES:  No known drug allergies.   MEDICATIONS:  1. Pepcid.  2. Plavix 75 mg daily.  3. Hydrochlorothiazide 12.5 mg daily.  4. Fish Oil.  5. Allopurinol 100 mg daily.  6. Lisinopril 40 mg daily.  7. Aspirin 325 mg daily.  8. Lovastatin 20 mg daily.  9. Clonidine 0.1 mg t.i.d.  10.Labetalol 200 mg t.i.d.   PAST MEDICAL HISTORY:  1. Coronary artery disease, status post coronary artery bypass graft in      2002, 2003 bare metal stent to the mid circumflex, 2006 Cypher stent to      the left anterior descending, June of 2006 an exercise Myoview showed      an ejection fraction of 58% with questionable small anterior apical      defect with possible reversibility, considered to be a low risk study.  2. Hypertension.  3. Hypercholesterolemia.  4. Obstructive sleep apnea on CPAP.  5. Obesity.  6. GERD.  7. Gout.   SOCIAL HISTORY:  Lives in [place] with his significant other.  Tobacco; quit smoking in 2002.  Alcohol; not excessive.  No illicit drugs.   FAMILY HISTORY:  Significant for coronary artery disease and stroke.   REVIEW OF SYSTEMS:  Significant for GERD, dyspepsia, and all other review of  systems were negative.   PHYSICAL EXAMINATION:  VITAL SIGNS:  Temperature 98.7, pulse 58, respiratory  rate 14, blood pressure on right is 187/110, on left is 194/108.  GENERAL:  He is a pleasant, obese man.  He is in no acute distress.  HEENT:  Pupils equal, round, and reactive to light and accommodation.  Extraocular muscles are intact.  Funduscopic examination revealed no  evidence of papilledema.  The fundus was clearly visualized bilaterally.  Dentition is fair.  NECK:  Supple with no significant lymphadenopathy.  Jugular venous pulsation  is obscured by obesity.  HEART:  Demonstrates a loudly deviated PMI, otherwise quiet precordium.  S1  and S2 are regular.  There is a faint S4 which is only appreciated with the  patient in the extreme left lateral decubitus position.  LUNGS:  Clear to auscultation with no wheezes, rales, or rhonchi.  SKIN:  No lesions.  ABDOMEN:  Soft and nontender with mild left upper quadrant tenderness on  deep palpation.  There is no rebound guarding or any suggestive peritoneal  signs.  There is no hepatosplenomegaly.  RECTAL:  Brown heme negative stool per the ER chart.  EXTREMITIES:  No cyanosis, clubbing, or edema and there is no extremity  rashes.  MUSCULOSKELETAL:  No gross joint deformities or effusions.  NEUROLOGY:  Alert and oriented x3.  Cranial nerves II-XII grossly intact.  Strength is noted 5/5 in all extremities and axial groups.  Normal sensation  throughout.  Normal cerebellar function.   Chest x-ray is a poor quality film due  to the patient's body habitus as well  as poor inspiratory effort.  There is significant osteopenia, suggestion of  cardiomegaly, in this portable AP film.  There is no infiltrate, no  exudates, and no significant edema.   EKG shows a rate of 58.  There is nonspecific ST changes, specifically less  than 1 mm ST elevations in lead 3, normal sinus rhythm with a normal axis.  PR interval 180, QRS duration 90, QTC 410, left ventricular hypertrophy is  noted.  No true ischemic changes and EKG is unchanged from prior studies.   LABORATORY DATA:  CBC is still pending.  Point of care markers were  negative.  Sodium is 139, potassium 4, chloride 103, bicarb is 37, BUN 13,  creatinine 1.1, glucose 97.   ASSESSMENT:  This is a 65 year old man with epigastric chest pain and a  history of coronary artery disease.   ADMISSION DIAGNOSES:  1. Unstable angina.  2. Hypertensive urgency.   PROBLEM LIST:  1. Unstable angina.  We will admit the patient on a nitroglycerin drip.      Given the patient's hypertensive emergency, we will titrate      nitroglycerin to ameliorate chest pain symptoms and to improve blood      pressure.  The patient does not tolerate nitroglycerin as an outpatient      due to a feeling of uneasiness that he gets and therefore, we will      consider adding longterm Norvasc to his home medical regimen to achieve      better blood pressure control.  Given his atypical component of his      chest pain, we have added proton pump inhibitors to his      medical regimen.  If his chest pain improves as well as appropriate      blood pressure, could consider repeating his stress test given the      recent study in June for signs of acute change.  However, should chest      pain not be abated with current medical regimen, would elect for      cardiac catheterization.      Daniel B. Haithcock, MD   Electronically Signed    ______________________________  Bevelyn Buckles. Bensimhon,  MD    DBH/MEDQ  D:  02/18/2006  T:  02/19/2006  Job:  [id number]

## 2010-11-03 NOTE — Discharge Summary (Signed)
NAMEROBEY, MASSMANN NO.:  [id number]   MEDICAL RECORD NO.:  [id number]          PATIENT TYPE:  INP   LOCATION:  2020                         FACILITY:  [facility]   PHYSICIAN:  Salvadore Farber, MD  DATE OF BIRTH:  1945-11-29   DATE OF ADMISSION:  02/18/2006  DATE OF DISCHARGE:                                 DISCHARGE SUMMARY   PRIMARY CARDIOLOGIST:  Dr. Arvilla Meres   PRIMARY CARE PHYSICIAN:  Dr. Alroy Dust   PRINCIPAL DIAGNOSIS:  Chest pain.   SECONDARY DIAGNOSES:  1. Coronary artery disease status post coronary artery bypass grafting in      2003 with bare-metal stent to the circumflex in 2003 and Cypher drug-      eluting stent in the left anterior descending coronary artery in      February 2006.  2. Hypertension.  3. Hyperlipidemia.  4. Obstructive sleep apnea on CPAP.  5. Obesity.  6. Gastroesophageal reflux disease.  7. Gout.   ALLERGIES:  No known drug allergies.   PROCEDURES:  None.   HISTORY OF PRESENT ILLNESS:  A 65 year old white male with prior history of  CAD status post CABG and stenting of the circumflex and LAD as listed above.  He was in his usual state of health until approximately 3 days prior to  admission when he began to experience epigastric and precordial chest pain  that had been worsening in intensity.  He saw his primary care physician and  was noted to be markedly hypertensive, and then presented to the ED for  evaluation on September 3.  In the ED his blood pressure was 194/108 and the  patient reported that he felt that his chest pain was more likely to be  reflux than his typical angina.  He apparently had been off his Nexium for  some time secondary to cost.  He was admitted for further evaluation.   HOSPITAL COURSE:  Mr. Sasso ruled out for MI by cardiac markers and ECG was  without any acute changes.  Once PPI was reinitiated he had resolution of  symptoms.  He was also initially placed on IV nitroglycerin for  improved  blood pressure control and this was weaned off and he was initiated on  Norvasc 10 mg daily.  He has been maintained on his labetalol and clonidine  as well as ACE inhibitor therapy.  He has not had any recurrent chest pain  and EKG has remained stable.  We plan to discharge today with close followup  of blood pressure with Dr. Kriste Basque and have also arranged an outpatient  adenosine Myoview at [facility] on February 22, 2006, at 7:15 a.m.   DISCHARGE LABORATORY:  Hemoglobin 15.4, hematocrit 46.4, WBC 8.3, platelets  222.  PT 14.0, INR 1.0.  Sodium 138, potassium 3.8, chloride 104, CO2 28,  BUN 14, creatinine 1.1, glucose 96, total bilirubin 0.8, alkaline  phosphatase 61, AST 22, ALT 26, total protein 6.4, albumin 3.5, calcium 9.0.  His cardiac markers were negative x3.  Total cholesterol 161, triglycerides  172, HDL 40, LDL 97.  DISPOSITION:  The patient is being discharged home today in good condition.   FOLLOWUP PLANS AND APPOINTMENTS:  He is asked to follow up with Dr. Kriste Basque in  1-2 weeks.  He is to follow up with Dr. Gala Romney on March 14, 2006, at  10:30 a.m.  He will have a pharmacologic stress test at [facility]  on February 22, 2006, at 7:15 a.m.   DISCHARGE MEDICATIONS:  1. Aspirin 81 mg daily.  2. Plavix 75 mg daily.  3. Labetalol 200 mg t.i.d.  4. Allopurinol 100 mg daily.  5. Hydrochlorothiazide 25 daily.  6. Norvasc 10 mg daily.  7. Lovastatin 40 mg q.h.s.  8. Clonidine 0.1 mg t.i.d.  9. Lisinopril 40 mg daily.  10.Fish oil 1000 mg daily.  11.Protonix 40 mg b.i.d.  12.Multivitamin one daily.  13.Nitroglycerin 0.4 mg sublingual p.r.n. chest pain.   OUTSTANDING LABORATORY STUDIES:  None.   DURATION OF DISCHARGE ENCOUNTER:  40 minutes including physician time.     ______________________________  Nicolasa Ducking, ANP      Salvadore Farber, MD  Electronically Signed    CB/MEDQ  D:  02/20/2006  T:  02/20/2006  Job:   [id number]   cc:   Lonzo Cloud. Kriste Basque, MD

## 2010-11-03 NOTE — Discharge Summary (Signed)
[facility]  Patient:    [name], [name] Visit Number: [id number] MRN: [id number]          Service Type: MED Location: [phone] Attending Physician:  Lenoria Farrier Dictated by:   Pennelope Bracken, N.P. Admit Date:  11/18/2001 Discharge Date: 11/21/2001   CC:         Bruce R. Juanda Chance, M.D. [facility]  Scott M. Kriste Basque, M.D. [facility]  Dr. Lasandra Beech, V.A., [place], [place]   Discharge Summary  CONSULTING PHYSICIAN:  Everardo Beals. Juanda Chance, M.D.  PRIMARY CARE PHYSICIAN:  Lonzo Cloud. Kriste Basque, M.D.  REASON FOR ADMISSION:  Chest pain.  DISCHARGE DIAGNOSES: 1. Coronary artery disease, status post CABG surgery, March 05, 2001, with    left internal mammary artery to left anterior descending, right internal    mammary artery to right coronary artery and saphenous vein grafts to    diagonal 3, first diagonal, and a sequential saphenous vein graft to the    posterolateral branch.  Cath this admission revealing an left anterior    descending with 80% proximal disease, 80% disease at the diagonal 1,    circumflex with 90% disease distally, totalled right coronary artery with    an right internal mammary artery to right coronary artery patent and a left    internal mammary artery to the left anterior descending patent.  Saphenous    vein grafts to the diagonal 1 and posterolateral branch have distal limb    occlusions, and the saphenous vein graft to diagonal 3 is totally occluded.    Left ventricular function is normal, ejection fraction being 65%. 2. Hypertension. 3. Strong family history of cardiovascular disease. 4. Gastroesophageal reflux disease. 5. Hyperlipidemia. 6. History of tobacco abuse.  HISTORY OF PRESENT ILLNESS:  This delightful 65 year old gentleman with medical history as outlined above, presented to primary care for evaluation of recurrent chest pain.  This had been progressive in nature and on day of admission, he experienced substernal chest pain  with associated diaphoresis and vertigo that was relieved by rest.  In the emergency department, his EKG showed lateral ST segment depression and T wave inversion which was new since postbypass.  It was decided he was appropriate for admission and further evaluation.  HOSPITAL COURSE:  The patient was admitted, and serial labs were drawn. Cardiac enzymes were negative in three sets.  He was placed on low molecule rate heparin, aspirin, beta blocker, and IV nitroglycerin and tolerated these well.  He experienced no further chest pain.  The patient was scheduled for a Cardiolite but then began to experience some chest pressure that was unlike previous angina, so he was taken to the cath lab on November 20, 2001, by Dr. Juanda Chance.  Findings are as outlined above.  Due to the 90% stenosis at the distal circumflex, a Cypher stent was placed, reducing stenosis there from 90% to less than 10.  The patient tolerated the procedure well and was returned to the floor in stable condition.  Echocardiography was obtained to evaluate the patients ventricular function. Findings were of a normal ejection fraction and mildly increased left ventricular wall thickness with some mild aortic calcification seen.  The patient continued to do well, and it was decided on November 21, 2001, by Dr. Andee Lineman, that he was appropriate for discharge.  PHYSICAL EXAMINATION:  GENERAL:  Day of discharge, the patient offered no complaints of chest pain, dyspnea, or palpitations.  VITAL SIGNS:  Blood pressure 142/78, pulse 65, respirations 20, pulse  oximeter 96% on room air, temperature afebrile.  LUNGS:  Clear to auscultation bilaterally.  HEART:  Regular rate and rhythm without murmur, rub or gallop.  EXTREMITIES:  No clubbing, cyanosis, or edema.  Cath site stable without evidence of hematoma or bruit.  LABORATORY DATA:  Discharge chemistry as follows:  Sodium 141, potassium 3.6, chloride 102, CO2 33, BUN 11, creatinine 1.3,  glucose 104.  Hemogram:  WBC 8.5, hemoglobin 15.6, hematocrit 46.0, platelets 193.  CK-MB, troponins were negative in three sets.  TSH was normal.  Lipid profile as follows:  Total cholesterol 185, triglycerides 145, HDL 46, LDL 110.  It is important to note that the patient ran out of his statin medication several weeks prior to this admission.   Prostate-specific antigen was obtained this admission for reasons unknown, but it is 3.62.  Erythrocyte sedimentation rate was 2.  Chest x-ray revealed stable cardiomegaly without active lung disease. Echocardiogram as described above.  Discharge EKG revealed normal sinus rhythm with T wave flattening in leads I, V5, and V6.  DISPOSITION:  The patient is discharged to home in the care of his very supportive step-daughter.  DISCHARGE MEDICATIONS: 1. Plavix 75 mg 1 q.d. x 6 months. 2. Aspirin 325 1 q.d. 3. Omega-3 fatty acids, fish oil 500 mg b.i.d. 4. Lopressor 50 mg b.i.d. 5. Protonix 40 mg 1 q.d. 6. Hydrochlorothiazide 25 mg 1 q.d. 7. Zestril 40 mg 1 q.d. 8. Nitroglycerin 0.4 sublingual 1 sublingual q.75m. x 3 p.r.n. chest pain. 9. Lovastatin 20 mg q 1800.  ACTIVITY RESTRICTIONS: 1. No driving, heavy lifting, sexual activity, or tub baths for two days. 2. The patient is encouraged to exercise daily.  DIET RECOMMENDED:  Low fat, low cholesterol diet, and instructions for this are given to the patient and step-daughter.  He agrees to call the office if his groin becomes hard or painful.  FOLLOW-UP:  He will follow up with the P.A. on November 28, 2001, at 8:45 and with Dr. Andee Lineman, January 14, 2002, at 10 a.m.  The patient is currently on disability and has to obtain his medications from the [facility].  The importance of taking all of his medications and the function of all of them is discussed  at great length with him, and he agrees to try to understand their importance. He will call in the interim with any change or increase in  symptoms. Dictated by:   Pennelope Bracken, N.P. Attending Physician:  Lenoria Farrier DD:  11/21/01 TD:  11/21/01 Job: [id number]

## 2010-11-03 NOTE — H&P (Signed)
[name], [name] NO.:  [id number]   MEDICAL RECORD NO.:  [id number]          PATIENT TYPE:  INP   LOCATION:  1843                         FACILITY:  [facility]   PHYSICIAN:  Jonelle Sidle, MD DATE OF BIRTH:  Feb 24, 1946   DATE OF ADMISSION:  07/01/2006  DATE OF DISCHARGE:                              HISTORY & PHYSICAL   PRIMARY CARDIOLOGIST:  Bevelyn Buckles. Bensimhon, M.D.   REASON FOR ADMISSION:  Chest pain.   HISTORY OF PRESENT ILLNESS:  [name] is a 65 year old male with a  history of coronary artery disease, status post coronary artery bypass  grafting in 2003 as well as Bare metal stent placement to the circumflex  coronary artery in 2003 and subsequent drug-eluting stent placement to  the left anterior descending in 2006.  Additional problems include  hypertension, hyperlipidemia, obstructive sleep apnea on CPAP,  gastroesophageal reflux disease, and recent laparoscopic cholecystectomy  in October of 2007.  He was seen in the hospital prior to this in  September with chest pain and ruled out for myocardial infarction at  that time.  He was set up for an outpatient Myoview which was apparently  reassuring, although, I do not have the formal results in hand.  He was  also to see Dr. Gala Romney subsequently in follow-up, but has not been  back to the office as yet.   [name] states that he has been in his usual state of health except  for a recent cold for which he has been using over-the-counter, blood  pressure safe cold medications and Claritin.  He has otherwise been  compliant with his medications including Plavix.  He denies any chest  pain or dyspnea, but states that suddenly earlier today he felt a  feeling of chills and numbness all over his body that was fairly intense  and followed by chest pain.  He thought it may have been indigestion  since it occurred after dinner, but it felt much more intense and  reminded him to some degree of prior  angina.  He did not use any  nitroglycerin, but came to the emergency department.  At the present  time, he is pain-free.  He denies having any frank fevers or rigors.  His electrocardiogram shows sinus rhythm with nonspecific ST changes and  his initial point of care cardiac markers were reassuring with a  troponin I level of less than 0.05.   ALLERGIES:  No known drug allergies.   PRESENT MEDICATIONS:  1. Aspirin 81 mg p.o. daily.  2. Plavix 75 mg p.o. daily.  3. Labetalol 200 mg p.o. t.i.d.  4. Allopurinol 100 mg p.o. daily.  5. Hydrochlorothiazide 25 mg p.o. daily.  6. Norvasc 10 mg p.o. daily.  7. Lovastatin 40 mg p.o. q.h.s.  8. Clonidine 0.1 mg p.o. t.i.d.  9. Lisinopril 40 mg p.o. daily.  10.Omega-3 Fish Oil 1000 mg p.o. daily.  11.Protonix 40 mg p.o. b.i.d.  12.Multivitamin one p.o. daily.  13.Sublingual nitroglycerin 0.4 mg p.r.n.   PAST MEDICAL HISTORY:   SOCIAL HISTORY:   FAMILY HISTORY:  All reviewed in the history and physical from September  of 2007.  He has a prior tobacco use history, but quit in 2002.  No  major interval history other than laparoscopic cholecystectomy in  October.   PHYSICAL EXAMINATION:  VITAL SIGNS:  Blood pressure 154/81, heart rate  is 61 and normal sinus rhythm, respirations 18.  GENERAL:  The patient is comfortable and in no acute distress, denying  active chest pain or dizziness.  HEENT:  Conjunctivae and lids are normal.  Oropharynx is clear.  NECK:  Supple without elevated jugular venous pressure or loud bruits.  No thyromegaly is noted.  LUNGS:  Clear without labored breathing.  HEART:  Regular rate and rhythm without loud murmur or gallop.  No  pericardial rub.  ABDOMEN:  Soft, nontender, and no hepatomegaly.  EXTREMITIES:  No significant pitting edema.  Pulses are 2+.  SKIN:  Warm and dry.  MUSCULOSKELETAL:  No kyphosis is noted.   LABORATORY DATA:  WBC 11.6, hemoglobin 15.6, platelets 266.  Sodium 135,  potassium 4.2,  BUN 21, creatinine 1.0, troponin I less than 0.05,  myoglobin 138.   Chest x-ray is presently pending.   IMPRESSION:  1. Recent symptoms of generalized numbness associated with chest pain.      The patient describes this as being fairly intense.  Nitroglycerin      paste was provided in the emergency department and the patient is      symptom-free at this time.  He has had no other recent exertional      chest pain or dyspnea.  His electrocardiogram was nonspecific and      his initial point of care cardiac markers are normal.  He      reportedly had a Myoview in September of 2007 that was reassuring      (details pending).  2. Known coronary artery disease, status post coronary artery bypass      grafting in 2003 with subsequent Bare metal stent placement to the      circumflex and most recently drug-eluting stent placement to the      left anterior descending in 2006.  3. Hypertension and hyperlipidemia.   PLAN:  The patient will be admitted for observation.  We will follow  telemetry, cycle cardiac markers, and follow up on electrocardiogram.  His standard cardiac medications will be continued and heparin will not  be initiated unless his cardiac markers trend abnormally.  The etiology  of the patient's symptoms is not entirely clear and he does admit  that these symptoms are not identical to prior presentations with heart  disease, although, do have some similar features.  His formal Myoview  report will be obtained tomorrow and further plans can be undertaken  after evaluation by Dr. Gala Romney.      Jonelle Sidle, MD  Electronically Signed     SGM/MEDQ  D:  07/01/2006  T:  07/02/2006  Job:  [id number]   cc:   Bevelyn Buckles. Bensimhon, MD

## 2010-11-03 NOTE — Op Note (Signed)
[place]. [facility]  Patient:    [name], [name] Visit Number: [id number] MRN: [id number]          Service Type: CAT Location: 2300 2311 [id number] Attending Physician:  Tressie Stalker Dictated by:   Salvatore Decent. [name], M.D. Proc. Date: 03/05/01 Admit Date:  03/04/2001   CC:         Lewayne Bunting, M.D. [facility]  Veneda Melter, M.D.  Lonzo Cloud. Kriste Basque, M.D. [facility]  CVTS office   Operative Report  PREOPERATIVE DIAGNOSIS:  Severe three vessel coronary artery disease with class IV unstable angina.  POSTOPERATIVE DIAGNOSIS:  Severe three vessel coronary artery disease with class IV unstable angina.  OPERATION:  Median sternotomy for coronary artery bypass grafting x 5 (left internal mammary artery to distal left anterior descending coronary artery, right internal mammary artery to distal right coronary artery, saphenous vein graft to third diagonal branch, saphenous vein graft to first diagonal and sequential saphenous vein graft to left posterolateral branch).  SURGEON:  Salvatore Decent. [name], M.D.  FIRST ASSISTANT:  Dominica Severin, P.A.  ANESTHESIA:  General.  INDICATIONS:  The patient is a 65 year old white male from [place] followed by Dr. Alroy Dust and referred by Dr. Lewayne Bunting and Dr. Veneda Melter for management of coronary artery disease.  The patient presents with a 3-4 to week history of progressive symptoms of angina culminating in chest pain occurring at rest.  Cardiac catheterization performed by Dr. Chales Abrahams demonstrates severe 3 vessel coronary artery disease with preserved left ventricular function.  A full consultation note has been dictated previously.  The patient is provided complete informed consent and desires to proceed with surgery as described.  DESCRIPTION OF PROCEDURE:  The patient was brought to the operating room on the above mentioned date and placed in the supine position on the operating table.  Central venous monitoring catheters are  placed under the care and direction of Dr. Darlyn Read and a right radial arterial line is placed. The patients left hand is examined and a pulse oximetry probe is placed on his left index finger.  During compression of the left radial pulse the wave form of the pulse oximetry tract appears to diminish by greater than 50%.  For this reason the radial artery is felt probably best to be avoided for use as a conduit for bypass surgery.   General endotracheal anesthesia is induced uneventfully.  Intravenous antibiotics are administered.  The patients chest, abdomen, both groins, and both lower extremities are prepared and draped in a sterile manner.  A median sternotomy incision is performed, and the left internal mammary artery is dissected from the chest wall and prepared for bypass grafting. Subsequently to this the patients right internal mammary artery is dissected from the chest wall and also prepared for bypass grafting.  Both internal mammary arteries are felt to be good quality conduit.  Simultaneously, saphenous vein is obtained from the patients right lower extremity through a longitudinal incision.  The saphenous vein is felt be good quality conduit.  The patient is heparinized systemically.  The pericardium is opened.  The ascending aorta is normal in appearance.  The ascending aorta and the right atrium are cannulated for cardiopulmonary bypass.  Adequate heparinization is verified.  Cardiopulmonary bypass is begun and the surface of the heart is inspected. There is moderate left ventricular hypertrophy.  There is mild scarring in the anterior apical portion of the left ventricle suggestive of a previous ischemic injury.  Portions of saphenous vein  and both internal mammary arteries are trimmed to appropriate length.  A temperature probe is placed in the left ventricular septum and a styrofoam pad is placed to protect the left phrenic nerve from thermal injury.  A   cardioplegia catheter is placed in the ascending aorta.  The patient is cooled to 32 degrees systemic temperature.  The aortic crossclamp is applied and cardioplegia is delivered in an antegrade fashion through the aortic root.  Iced saline slush is applied for topical hypothermia.  The initial cardioplegic arrest and myocardial cooling are felt to be excellent.  Repeat doses of cardioplegia are administered intermittently throughout the crossclamp portion of the operation both through the aortic root and down subsequently placed vein grafts to maintain septal temperature below 15 degrees Centigrade.  The following distal coronary anastomosis are performed:  1.  The first diagonal branch is grafted with the saphenous vein in a side-to-side fashion.  This coronary measures 2 mm in diameter and is a very large vessel which traverses across the anterolateral wall in an orientation typically found in patients with large ramus intermediate branch vessels. There is only mild proximal disease in this vessel but there is severe atherosclerotic plaque which is both visible and palpable.  2. The posterolateral branch of the distal left circumflex coronary artery is grafted using a sequential saphenous vein graft off the vein placed in the diagonal branch.  This coronary measures 1.5 mm in diameter and is of good quality.  There is 70% proximal stenosis.  3. The third diagonal branch is grafted with a saphenous vein graft in an end-to-side fashion.  This coronary measures 1.5 mm in diameter and is of fair to good quality.  There is 90% proximal stenosis.  4. The distal right coronary artery is grafted with the right internal mammary artery in an end-to-side fashion.  This coronary measures 1.7 mm at the site  of distal bypass and is of fair to good quality.  Proximal to this the distal right coronary artery is diffusely diseased.  There is 90 to 95% proximal stenosis.  5. The distal left  anterior descending coronary artery is grafted with the left internal mammary artery in an end-to-side fashion.  This coronary measures 2.0 mm in diameter and is of fair to good quality.  Both proximal saphenous vein anastomosis are performed directly to the ascending aorta prior to removal of the aortic crossclamp.  The septal temperature is noted to rise rapidly and dramatically upon reperfusion of the internal mammary arteries. The heart begins to beat spontaneously.  The aortic crossclamp is removed after a total crossclamp time of 91 minutes.  All proximal and distal anastomosis are inspected for hemostasis and appropriate graft orientation.  Epicardial pacing wires are affixed to the right ventricular outflow tract and to the right atrial appendage.  The patient is rewarmed to greater than 37 degrees centigrade temperature.  The patient is weaned from cardiopulmonary bypass without difficulty.  The patients rhythm at separation from bypass is normal sinus rhythm.  No inotropic support is required.  Total cardiopulmonary bypass time for the operation is 137 minutes.  The venous and arterial cannulae are removed uneventfully.  Protamine is administered to reverse the anticoagulation.  The mediastinum and both left and right pleural spaces are irrigated with saline solution containing vancomycin.  Meticulous surgical hemostasis is ascertained.  The mediastinum and both left and right pleural spaces are drained with 4 chest tubes placed through separate stab incisions inferiorly.  The median sternotomy is  closed in a routine fashion.  The right lower extremity incision is closed in multiple layers in a routine fashion.  All skin incisions are closed with subcuticular skin closures.  The patient tolerated the procedure well and is transported to the SICU in stable condition.  There are no intraoperative complications.  All sponge, instrument and needle counts are verified correct at  completion of the operation.  No blood products were administered.     The patient was rewarmed to greater than 36.5 degrees Centigrade.  Epicardial pacing wires were affixed to the right ventricular outflow tract and to the right atrial appendage.  The patient was weaned from cardiopulmonary bypass without difficulty.  The patients rhythm at separation from bypass is normal sinus rhythm requiring atrial pacing for rate only.  The patient is on no inotropic support at separation from bypass.  The total cardiopulmonary bypass time for the operation is 62 minutes.  The venous and arterial cannulae were removed uneventfully.  Protamine was administered to reverse the coagulation.  The mediastinum and left chest are irrigated with saline solution containing vancomycin antibiotics.  Meticulous hemostasis was ascertained.  The mediastinum and the left chest are drained using three chest tubes placed through separate stab incisions inferiorly. The median sternotomy and the left lower extremity incisions were all closed in a routine fashion.  All skin incisions were closed with skin staples.  The patient tolerated the procedure well and was transported to the surgical intensive care unit in stable condition.  There were no intraoperative complications.  All sponge, instrument and needle counts are verified correct at the completion of the operation.  The patient required no autologous blood products. Dictated by:   Salvatore Decent [name], M.D. Attending Physician:  Tressie Stalker DD:  03/05/01 TD:  03/05/01 Job: [id number]

## 2010-11-08 ENCOUNTER — Encounter: Payer: Self-pay | Admitting: Internal Medicine

## 2010-11-09 ENCOUNTER — Other Ambulatory Visit: Payer: Self-pay

## 2010-11-14 ENCOUNTER — Ambulatory Visit (INDEPENDENT_AMBULATORY_CARE_PROVIDER_SITE_OTHER): Payer: Medicare Other | Admitting: Internal Medicine

## 2010-11-14 ENCOUNTER — Encounter: Payer: Self-pay | Admitting: Internal Medicine

## 2010-11-14 VITALS — BP 108/64 | HR 58 | Resp 18 | Ht 69.0 in | Wt 254.8 lb

## 2010-11-14 DIAGNOSIS — I2510 Atherosclerotic heart disease of native coronary artery without angina pectoris: Secondary | ICD-10-CM

## 2010-11-14 DIAGNOSIS — E785 Hyperlipidemia, unspecified: Secondary | ICD-10-CM

## 2010-11-14 DIAGNOSIS — I10 Essential (primary) hypertension: Secondary | ICD-10-CM

## 2010-11-14 NOTE — Assessment & Plan Note (Signed)
No evidence of ischemia. Continue current regimen.   

## 2010-11-14 NOTE — Patient Instructions (Signed)
Your physician recommends that you schedule a follow-up appointment in: 1 year with Dr. Gala Romney

## 2010-11-14 NOTE — Assessment & Plan Note (Signed)
Followed by Dr. Kriste Basque. LDL at goal. Continue fish oil and exercise for low HDL.

## 2010-11-14 NOTE — Progress Notes (Signed)
HPI:  [name] is a very pleasant 65 year old male with a history of HTN, OSA, HL and CAD s/p CABG 2002 and stenting of LAD (2006) and LCX (2003) with drug-eluting stents  Had ETT 4/11 which was normal.   Doing very well. Remains fairly active. Walks on the treadmill several times per week. 30-40 mins at a time. Also working outside. Denies CP or significant DOE. Compliant with all medications. BP well controlled.   Lipids followed by Dr. Kriste Basque. Recent lipids in 2/12.  LDL 65 HDL 31   ROS: All systems negative except as listed in HPI, PMH and Problem List.  Past Medical History  Diagnosis Date  . CAD (coronary artery disease)     s/p CABG  . Allergic rhinitis   . OSA (obstructive sleep apnea)   . HTN (hypertension)   . Hyperlipidemia   . Obesity   . GERD (gastroesophageal reflux disease)   . Diverticulosis of colon   . History of colonic polyps   . Renal insufficiency   . Organic impotence   . DJD (degenerative joint disease)   . Lumbar back pain   . Hyperuricemia   . Anxiety   . Hemorrhoids   . Chronic gastritis   . Herpes   . History of colonoscopy     Current Outpatient Prescriptions  Medication Sig Dispense Refill  . acyclovir (ZOVIRAX) 200 MG capsule Take 3 tabs daily as directed       . allopurinol (ZYLOPRIM) 300 MG tablet Take 300 mg by mouth daily.        Marland Kitchen amLODipine (NORVASC) 10 MG tablet Take 10 mg by mouth daily.        Marland Kitchen aspirin 81 MG tablet Take 81 mg by mouth daily.        . cloNIDine (CATAPRES) 0.1 MG tablet Take 0.1 mg by mouth 3 (three) times daily.        . clopidogrel (PLAVIX) 75 MG tablet Take 75 mg by mouth daily.        . fish oil-omega-3 fatty acids 1000 MG capsule Take 2 g by mouth daily.        . fluticasone (FLONASE) 50 MCG/ACT nasal spray 2 sprays by Nasal route at bedtime.        . hydrochlorothiazide 25 MG tablet Take 25 mg by mouth daily.        Marland Kitchen HYDROcodone-acetaminophen (VICODIN) 5-500 MG per tablet Take 1 tablet by mouth every 6 (six)  hours as needed.        . hyoscyamine (LEVSIN) 0.125 MG/5ML ELIX Take by mouth. 1 under tongue every 4 hours as needed for abdominal pain       . labetalol (NORMODYNE) 200 MG tablet Take 200 mg by mouth 3 (three) times daily.        Marland Kitchen lisinopril (PRINIVIL,ZESTRIL) 40 MG tablet Take 40 mg by mouth daily.        Marland Kitchen lovastatin (MEVACOR) 40 MG tablet Take 80 mg by mouth at bedtime.        . nitroGLYCERIN (NITROSTAT) 0.4 MG SL tablet Place 0.4 mg under the tongue every 5 (five) minutes as needed.        . pantoprazole (PROTONIX) 40 MG tablet Take 40 mg by mouth daily.           PHYSICAL EXAM: Filed Vitals:   11/14/10 1023  BP: 108/64  Resp: 18   General:  Well appearing. No resp difficulty HEENT: normal Neck: supple. JVP flat. Carotids 2+ bilaterally;  no bruits. No lymphadenopathy or thryomegaly appreciated. Cor: PMI normal. Regular rate & rhythm. No rubs, gallops or murmurs. Lungs: clear Abdomen: soft, nontender, nondistended. No hepatosplenomegaly. No bruits or masses. Good bowel sounds. Extremities: no cyanosis, clubbing, rash, edema Neuro: alert & orientedx3, cranial nerves grossly intact. Moves all 4 extremities w/o difficulty. Affect pleasant.    ECG: Sinus brady 57 No ST-T wave abnormalities.     ASSESSMENT & PLAN:

## 2010-11-14 NOTE — Assessment & Plan Note (Signed)
Blood pressure well controlled. Continue current regimen.  

## 2010-11-17 ENCOUNTER — Telehealth: Payer: Self-pay | Admitting: Pulmonary Disease

## 2010-11-17 MED ORDER — AMOXICILLIN-POT CLAVULANATE 875-125 MG PO TABS: 1.0000 | ORAL_TABLET | Freq: Two times a day (BID) | ORAL | Status: AC

## 2010-11-17 NOTE — Telephone Encounter (Signed)
Pt c/o sore throat, body aches, productive cough with "cream" color mucus, runny nose, nasal congestion x 3 days. Request something to be called in to [facility]. Please advise. Thank you. NKDA

## 2010-11-17 NOTE — Telephone Encounter (Signed)
Per SN---ok for pt to have augmentin 875  #14   1 po bid until gone,  This has been sent to the pts pharmacy and pt is aware.

## 2010-11-30 ENCOUNTER — Telehealth: Payer: Self-pay | Admitting: Pulmonary Disease

## 2010-11-30 ENCOUNTER — Telehealth: Payer: Self-pay | Admitting: Internal Medicine

## 2010-11-30 NOTE — Telephone Encounter (Signed)
Called and spoke with pt. Pt states he has had a colonoscopy done "several months ago."  But states ever since then he has had small amounts of blood in stool.  Pt states this has gradually gotten worse.  States now he notices BRB "floating in the toilet" after a BM.  Pt states it hurts to have a BM- "feels like tearing."  Pt sees Dr. Marina Goodell.  Recommended pt call Dr. Marina Goodell today to discuss this.  Pt verbalized understanding and stated he would call Dr. Marina Goodell now.

## 2010-11-30 NOTE — Telephone Encounter (Signed)
Pt states that he is having some rectal bleeding and quite a bit of pain when he has a bowel movement. State he feels like something is "ripping." Requests an appt. Pt scheduled to see Amy Esterwood PA 12/01/10@1 :30pm. Pt aware of appt date and time.

## 2010-12-01 ENCOUNTER — Telehealth: Payer: Self-pay | Admitting: Pulmonary Disease

## 2010-12-01 ENCOUNTER — Other Ambulatory Visit (INDEPENDENT_AMBULATORY_CARE_PROVIDER_SITE_OTHER): Payer: Medicare Other

## 2010-12-01 ENCOUNTER — Encounter: Payer: Self-pay | Admitting: Physician Assistant

## 2010-12-01 ENCOUNTER — Ambulatory Visit (INDEPENDENT_AMBULATORY_CARE_PROVIDER_SITE_OTHER): Payer: Medicare Other | Admitting: Physician Assistant

## 2010-12-01 VITALS — BP 98/56 | HR 52 | Ht 69.0 in | Wt 248.8 lb

## 2010-12-01 DIAGNOSIS — K625 Hemorrhage of anus and rectum: Secondary | ICD-10-CM

## 2010-12-01 DIAGNOSIS — K648 Other hemorrhoids: Secondary | ICD-10-CM

## 2010-12-01 DIAGNOSIS — K219 Gastro-esophageal reflux disease without esophagitis: Secondary | ICD-10-CM

## 2010-12-01 DIAGNOSIS — K222 Esophageal obstruction: Secondary | ICD-10-CM

## 2010-12-01 DIAGNOSIS — Z7901 Long term (current) use of anticoagulants: Secondary | ICD-10-CM

## 2010-12-01 LAB — CBC WITH DIFFERENTIAL/PLATELET
Basophils Relative: 0.3 % (ref 0.0–3.0)
Eosinophils Absolute: 0.6 10*3/uL (ref 0.0–0.7)
Eosinophils Relative: 5.1 % — ABNORMAL HIGH (ref 0.0–5.0)
Hemoglobin: 14.6 g/dL (ref 13.0–17.0)
MCHC: 34.1 g/dL (ref 30.0–36.0)
MCV: 87.4 fl (ref 78.0–100.0)
Monocytes Absolute: 0.7 10*3/uL (ref 0.1–1.0)
Neutro Abs: 8.3 10*3/uL — ABNORMAL HIGH (ref 1.4–7.7)
RBC: 4.9 Mil/uL (ref 4.22–5.81)
WBC: 11.2 10*3/uL — ABNORMAL HIGH (ref 4.5–10.5)

## 2010-12-01 MED ORDER — PRAMOXINE-HC 1-2.5 % EX CREA: TOPICAL_CREAM | CUTANEOUS | Status: DC

## 2010-12-01 NOTE — Patient Instructions (Addendum)
Please go to the basement level to have your labs drawn.  We made you a follow up appointment with Dr. Marina Goodell on 01-09-2011 @ 10:00 AM .  We sent a prescription for Analpram HC cream to [facility].  Get Balmex cream at the pharmacy, use 3 times daily x 10 days then as needed. Use moistened wipes after a bowel movement.

## 2010-12-01 NOTE — Telephone Encounter (Signed)
Called and spoke with pt. Pt aware of TP's recs.  Pt verbalized understanding and denied any questions.

## 2010-12-01 NOTE — Telephone Encounter (Signed)
Spoke with pt.  He states called back on 11/17/10 with sore throat, cough, fever, aches- given augmentin 875 bid x 1 wk. He states no more aches, fever, sore throat- but still has prod cough with large amounts of yellow/light green sputum. Wants recs. Will forward to TP. Please advise thanks! No Known Allergies

## 2010-12-01 NOTE — Progress Notes (Signed)
Subjective:    Patient ID: [name], male    DOB: 1945-07-17, 65 y.o.   MRN: [id number]  HPI [name] is a 65 year old white male known to Dr. Margaret Pyle from colonoscopy which was done in February of 2012 for followup. He was found to have a diminutive polyp which was removed severe diverticulosis throughout the colon and internal hemorrhoids. Patient is maintained on chronic Plavix and aspirin for coronary artery disease areas He comes in today with complaints of hemorrhoidal symptoms which have been progressive. He says he has had intermittent rectal bleeding of long-term but over the past couple of weeks has had more problems with pain and stinging with bowel movements. He also complains of discomfort with sitting after bowel movements. He continues to see blood with most bowel movements either on the tissue were occasionally in the commode. He has no current complaints of abdominal pain cramping etc.    Review of Systems  Constitutional: Negative.   HENT: Negative.   Eyes: Negative.   Respiratory: Negative.   Cardiovascular: Negative.   Gastrointestinal: Positive for anal bleeding and rectal pain.  Genitourinary: Negative.   Musculoskeletal: Negative.   Skin: Negative.   Neurological: Negative.   Hematological: Bruises/bleeds easily.  Psychiatric/Behavioral: Negative.    Outpatient Prescriptions Prior to Visit  Medication Sig Dispense Refill  . allopurinol (ZYLOPRIM) 300 MG tablet Take 300 mg by mouth daily.       Marland Kitchen amLODipine (NORVASC) 10 MG tablet Take 10 mg by mouth daily.        Marland Kitchen aspirin 81 MG tablet Take 81 mg by mouth daily.        . cloNIDine (CATAPRES) 0.1 MG tablet Take 0.1 mg by mouth 3 (three) times daily.        . clopidogrel (PLAVIX) 75 MG tablet Take 75 mg by mouth daily.        . fexofenadine (ALLEGRA) 180 MG tablet Take 180 mg by mouth daily.        . fish oil-omega-3 fatty acids 1000 MG capsule Take 2 g by mouth daily.        . fluticasone (FLONASE) 50 MCG/ACT nasal  spray 2 sprays by Nasal route at bedtime.        . hydrochlorothiazide 25 MG tablet Take 25 mg by mouth daily.        Marland Kitchen HYDROcodone-acetaminophen (VICODIN) 5-500 MG per tablet Take 1 tablet by mouth every 6 (six) hours as needed.        . hyoscyamine (LEVSIN) 0.125 MG/5ML ELIX Take by mouth. 1 under tongue every 4 hours as needed for abdominal pain       . labetalol (NORMODYNE) 200 MG tablet Take 200 mg by mouth 3 (three) times daily.        Marland Kitchen lisinopril (PRINIVIL,ZESTRIL) 40 MG tablet Take 40 mg by mouth daily.        Marland Kitchen lovastatin (MEVACOR) 40 MG tablet Take 80 mg by mouth at bedtime.        . nitroGLYCERIN (NITROSTAT) 0.4 MG SL tablet Place 0.4 mg under the tongue every 5 (five) minutes as needed.        . pantoprazole (PROTONIX) 40 MG tablet Take 40 mg by mouth daily.             Objective:   Physical Exam Well-developed obese white male in no acute distress, pleasant, alert and oriented x3 HEENT; nontraumatic normocephalic EOMI PERRLA sclera anicteric Nck; Supple no JVD  Cardiovascular; regular rate and rhythm  with S1-S2 no murmur or gallop  Pulmonary; clear bilaterally  Abdomen; obese soft nontender nondistended bowel sounds active no palpable mass or hepatosplenomegaly  Rectal ;exam external perianal skin is moist and erythematous he also has a shallow anal skin tear there areno external hemorrhoids on anoscopy he does have inflamed internal hemorrhoids, nonthrombosed.        Assessment & Plan:  #58 65 year old male on chronic aspirin and Plavix for coronary artery disease with persistent small volume hematochezia and two-week history of external and internal anal discomfort. Suspect most of his bleeding is coming from the internal hemorrhoids, however he also has perianal irritation and a shallow perianal skin tear which is certainly causing discomfort.  Plan; Trial of Balmex cream 3 times daily to the perianal area Advised moistened tissue to decreased skin irritation Start Analpram  cream 2.5% 3 times daily for internal hemorrhoids. Followup with Dr. Marina Goodell in 3-4 weeks and if symptoms have not improved may need to consider surgical consultation versus injection therapy.  #2 History of adenomatous colon polyps Last colonoscopy February 2012 due for followup February 2017

## 2010-12-01 NOTE — Telephone Encounter (Signed)
Need to use mucinex dm Twice daily  As needed  Cough/congesion  Fluids and rest  If not improving will need ov.  Please contact office for sooner follow up if symptoms do not improve or worsen or seek emergency care

## 2010-12-04 ENCOUNTER — Telehealth: Payer: Self-pay | Admitting: *Deleted

## 2010-12-04 NOTE — Telephone Encounter (Signed)
Results given to patient

## 2010-12-04 NOTE — Progress Notes (Signed)
Agree with assessment and plan Iva Boop, MD, [facility]

## 2010-12-04 NOTE — Telephone Encounter (Signed)
Message copied by Daphine Deutscher on Mon Dec 04, 2010  8:31 AM ------      Message from: [place], [place] S      Created: Sun Dec 03, 2010  8:03 AM       Please let pt know his hgb is normal, no anemia

## 2011-01-09 ENCOUNTER — Ambulatory Visit: Payer: Medicare Other | Admitting: Internal Medicine

## 2011-02-13 ENCOUNTER — Other Ambulatory Visit (INDEPENDENT_AMBULATORY_CARE_PROVIDER_SITE_OTHER): Payer: Medicare Other

## 2011-02-13 ENCOUNTER — Ambulatory Visit (INDEPENDENT_AMBULATORY_CARE_PROVIDER_SITE_OTHER): Payer: Medicare Other | Admitting: Pulmonary Disease

## 2011-02-13 ENCOUNTER — Encounter: Payer: Self-pay | Admitting: Pulmonary Disease

## 2011-02-13 DIAGNOSIS — M1990 Unspecified osteoarthritis, unspecified site: Secondary | ICD-10-CM

## 2011-02-13 DIAGNOSIS — G4733 Obstructive sleep apnea (adult) (pediatric): Secondary | ICD-10-CM

## 2011-02-13 DIAGNOSIS — E669 Obesity, unspecified: Secondary | ICD-10-CM

## 2011-02-13 DIAGNOSIS — K219 Gastro-esophageal reflux disease without esophagitis: Secondary | ICD-10-CM

## 2011-02-13 DIAGNOSIS — E785 Hyperlipidemia, unspecified: Secondary | ICD-10-CM

## 2011-02-13 DIAGNOSIS — I10 Essential (primary) hypertension: Secondary | ICD-10-CM

## 2011-02-13 DIAGNOSIS — D126 Benign neoplasm of colon, unspecified: Secondary | ICD-10-CM

## 2011-02-13 DIAGNOSIS — K5730 Diverticulosis of large intestine without perforation or abscess without bleeding: Secondary | ICD-10-CM

## 2011-02-13 DIAGNOSIS — M109 Gout, unspecified: Secondary | ICD-10-CM

## 2011-02-13 DIAGNOSIS — N259 Disorder resulting from impaired renal tubular function, unspecified: Secondary | ICD-10-CM

## 2011-02-13 DIAGNOSIS — I2510 Atherosclerotic heart disease of native coronary artery without angina pectoris: Secondary | ICD-10-CM

## 2011-02-13 DIAGNOSIS — F411 Generalized anxiety disorder: Secondary | ICD-10-CM

## 2011-02-13 DIAGNOSIS — J309 Allergic rhinitis, unspecified: Secondary | ICD-10-CM

## 2011-02-13 LAB — LIPID PANEL
Cholesterol: 146 mg/dL (ref 0–200)
HDL: 41.9 mg/dL (ref >39.00)
VLDL: 22.8 mg/dL (ref 0.0–40.0)

## 2011-02-13 LAB — BASIC METABOLIC PANEL
BUN: 20 mg/dL (ref 6–23)
GFR: 62 mL/min (ref >60.00)
Glucose, Bld: 98 mg/dL (ref 70–99)
Potassium: 4.2 mEq/L (ref 3.5–5.1)

## 2011-02-13 MED ORDER — FUROSEMIDE 20 MG PO TABS: 20.0000 mg | ORAL_TABLET | Freq: Every day | ORAL | Status: DC

## 2011-02-13 NOTE — Patient Instructions (Signed)
Today we updated you med list in EPIC...    We decided to change your HCTZ to LASIX to help more w/ the swelling in your legs...    Remember to eliminate ALL the salt from your diet!!!  Today we did your follow up fasting blood work...    Please call the PHONE TREE in a few days for your results...    Dial [id number] & when prompted enter your patient number followed by the # symbol...    Your patient number is:   [id number]  Let's get on track w/ our diet & exercise program>    If you are doing the proper job you WILL be losing weight!!!  Call for any questions...  Let's plan a follow up visit again in 6 months, sooner if needed for problems.Marland KitchenMarland Kitchen

## 2011-02-13 NOTE — Progress Notes (Signed)
Subjective:    Patient ID: [name], male    DOB: [birth year], 65 y.o.   MRN: [id number]  HPI 65 y/o WM here for a follow up visit... he has multiple medical problems including OSA on CPAP;  HBP controlled on 5 drug regimen;  CAD- s/p CABGx4 in 2002 & followed by DrBensimhon;  Hyperlipidemia on Lovastatin;  Obesity & just can't seen to lose the weight;  GERD/ Divertics/ Polyps followed by DrPerry for GI;  DJD & hx hyperuricemia;  Anxiety...  ~  February 14, 2010:  c/o some left sholder pain> treatedw/ Motrin, Skelaxin, Ice, etc> improved...  he had one epis of CP after eating pizza & it resolved w/ antacids... he saw DrBensimhon 3/11> HBP, CAD s/p CABG, Hyperlipidemia & Obesity... atypic CP & Stress Test was neg- no signif ST segment depression... also had ArtDoppler's of LE's w/ norm ABI's & no evid obstruction...  BP controlled on meds;  Chol looks OK on Lovastatin;  wt down sl to 251# but he has a long way to go!  ~  August 14, 2010:  51mo ROV- CC= left shoulder & arm pain x2-3 months & he takes Vicodin w/ relief but hasn't seen Ortho for eval & he will call DrBeane when he is ready;  note- hx hyperuricemia, on Allopurinol 300mg /d & Uric= 5.1 today...    He saw DrPerry 1/12 w/ reflux symptoms, dysphagia, abd pain & rectal bleeding> EGD & Colonoscopy 2/12 showed esoph stricture- dilated, and divertics, hems, & one adenomatous polyp removed (f/u planned 57yrs);  he remains on Protonix daily & Levsin as needed along w/ laxatives Prn.    He saw DrBensimhon 1/12 for Cardiac eval prior to his procedures> felt to be stable, BP controlled, & doing satis overall- OK to hold Plavix for the procedures;  continued on his med regimen w/ Labetolol, Catapress, Amlodipine, Lisinopril, HCTZ, ASA/ Plavix.    He remains on Lovastatin80 for his Chol> FLP today looks good x sl incr TG 153 & low HDL 31;  discussed low fat diet & exercise program (difficult for him due to arthritis).    Otherw stable> we reviewed meds &  he requests 90 refills;  Fasting labs reviewed;  last CXR 3/11 showed NAD- s/p CABG, scarring right mid lung zone, spondylosis Tspine...  ~  February 13, 2011:  51mo ROV & c/o allergies, hems, arthritis/ gout > BP controlled on regimen (see below) including HCT but he wants stronger diuretic & we will switch to Lasix20;  Known CAD stable on meds + ASA/Plavix w/o angina; remains on Lovastatin for Chol w/ labs below;  Has not been able to lose weight despite diet counseling etc> he really need to try harder...    He had an EGD 2/12 by DrPerry (sl stricture- dilated, otherw WNL), & Colonoscopy 2/12 w/ diminutive polyp, severe divertics, & int hems> path= tubular adenoma...    He saw GI/ Amy 6/12 for worsening hems w/ bleeding (on ASA/ Plavix)> Rx Balmex & Analpram 2.5% creams Tid, & f/u w/ DrPerry to see if injection vs surg is needed; Hg= 14.6     He saw DrBensimhon 5/12 for f/u HBP, CAD s/p CABG 2002 & stenting of LAD 2006 & Circ 2003 w/ drug eluting stents> doing well, no angina, exercising some, no changes made; EGK w/ SBrady, NAD...          PROBLEM LIST:   ALLERGIC RHINITIS (ICD-477.9) - advised not to use Afrin etc... rather take OTC antihist (Claritin,  Zyrtek, etc), Saline, FLONASE Qhs...  OBSTRUCTIVE SLEEP APNEA (ICD-327.23) - on CPAP regularly (can't locate sleep study results), doing well by his report.  HYPERTENSION (ICD-401.9) - well controlled on a 5 drug regimen: LABETALOL 200mg Tid,  CATAPRES 0.1mg Tid,  AMLODIPINE 10mg /d,  LISINOPRIL 40mg /d,  HCTZ 25mg /d... takes meds regularly and tol well... BP=118/72 today- denies HA, fatigue, visual changes, CP, palipit, dizziness, syncope, dyspnea, edema, etc... needs better diet, continue same meds. ~  CXR 3/11 is clear, WNL (heart upper limit of norm). ~  8/12:  He wants stronger diuretic & we will change HCT to LASIX 20mg /d, reminded regarding low sodium!  CORONARY ARTERY DISEASE (ICD-414.00) - on above meds + ASA 81mg /d & PLAVIX 75mg /d... S/P  CABG x4 9/02 by DrOwen... subseq PTCA/stent to native CIRC 6/03 (& occluded vein graft to LAD noted); last cath = 2/06 w/ PTCA/stent in LAD distal to LIMA touchdown, otherw no change... NuclearStressTest 9/07 showed neg w/ EF=62%... ~  3/10:  yearly ROV DrBensimhon doing well, no changes made... ~  3/11:  stable- StressTest was neg- no signif ST seg depression; ArtDopplers LEs showed norm ABIs.  HYPERLIPIDEMIA (ICD-272.4) - back on LOVASTATIN 80mg /d (40mg - 2tabsQhs) due to $$$... + FISH OIL 3/d... ~  FLP 7/08 on Lovastatin showed TChol 157, TG 127, HDL 30, LDL 101... ch to LIP40 per Cards... ~  FLP 1/09 on Lip40 showed TChol 125, Tg 105, HDL 31, LDL 73... ch back to Lovastin80 due to $$$... ~  FLP 7/09 on Lova80 showed TChol 142, TG 111, HDL 31, LDL 89... rec- continue same, get wt down. ~  FLP 1/10 showed TChol 130, TG 99, HDL 30, LDL 80... rec> get wt down or stronger statin Rx! ~  FLP 3/11 showed TChol 138, TG 96, HDL 43, LDL 76... continue same. ~  FLP 8/11 showed TChol 140, TG 145, HDL 33, LDL 78 ~  FLP 8/12 showed TChol 146, TG 114, HDL 42, LDL 81  OBESITY (ICD-278.00) ~  7/09:  he's done a better job w/ diet + exercise and weight down 13# in 33mo to 242#. ~  1/10:  unfortunately weight back up 11# over the holidays to 253#... rec- diet/ exercise discussed. ~  10/10: weight = 260# but pt notes he & wife to start diet soon!!! ~  3/11:  weight = 260# ~  8/11:  weight = 251#.Marland Kitchen. states "my wife keeps on bringing it home" ~  8/12:  Weight = 257#... We reviewed diet & exercise prescriptions!  GERD (ICD-530.81) - last EGD by DrSam 3/06 revealed severe antral gastitis.Marland Kitchen. now on PROTONIX 40mg /d, and PEPCID 20mg Qhs... ~  EGD 2/12 by DrPerry (sl stricture- dilated, otherw WNL)...  DIVERTICULOSIS OF COLON (ICD-562.10) & COLONIC POLYPS (ICD-211.3)  ~  Colonoscopy 7/05 by DrSam showed divertics, hems, otherw neg... f/u planned 36yrs. ~  Colonoscopy 2/12 w/ diminutive polyp, severe divertics, & int  hems> path= tubular adenoma...  RENAL INSUFFICIENCY (ICD-588.9) - he knows to avoid NSAIDs and nephrotoxic drugs... 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  DEGENERATIVE JOINT DISEASE (ICD-715.90) & HYPERURICEMIA (ICD-790.6) - as per eval DrBeane w/ ?gout- Rx'd w/ left knee shot, Indocin... back on ALLOPURINOL 300mg /d now... ~  labs 3/09  showed Uric= 8.7... ~  labs 7/09 showed Uric= 5.6.Marland KitchenMarland Kitchen rec- continue Allopurinol. ~  9/10: dropped timber on left foot w/ hematoma, cellulitis- Rx'd... ~  labs 9/10 showed Uric= 5.9 on Uloric40. ~  labs 3/11 showed URIC= 6.3 back on Allopurinol 300mg /d. ~  Labs 8/12 showed Uric = 6.2  BACK PAIN, LUMBAR (ICD-724.2)  ANXIETY (ICD-300.00)   Past Surgical History  Procedure Date  . Coronary artery bypass graft   . Angioplasty   . Cholecystectomy 2006    [place]    Outpatient Encounter Prescriptions as of 02/13/2011  Medication Sig Dispense Refill  . allopurinol (ZYLOPRIM) 300 MG tablet Take 300 mg by mouth daily.       Marland Kitchen amLODipine (NORVASC) 10 MG tablet Take 10 mg by mouth daily.        Marland Kitchen aspirin 81 MG tablet Take 81 mg by mouth daily.        . cloNIDine (CATAPRES) 0.1 MG tablet Take 0.1 mg by mouth 3 (three) times daily.        . clopidogrel (PLAVIX) 75 MG tablet Take 75 mg by mouth daily.        . fexofenadine (ALLEGRA) 180 MG tablet Take 180 mg by mouth daily.        . fish oil-omega-3 fatty acids 1000 MG capsule Take 2 g by mouth daily.        . fluticasone (FLONASE) 50 MCG/ACT nasal spray 2 sprays by Nasal route at bedtime.        . hydrochlorothiazide 25 MG tablet Take 25 mg by mouth daily.        Marland Kitchen HYDROcodone-acetaminophen (VICODIN) 5-500 MG per tablet Take 1 tablet by mouth every 6 (six) hours as  needed.        . hyoscyamine (LEVSIN) 0.125 MG/5ML ELIX Take by mouth. 1 under tongue every 4 hours as needed for abdominal pain       . labetalol (NORMODYNE) 200 MG tablet Take 200 mg by mouth 3 (three) times daily.        Marland Kitchen lisinopril (PRINIVIL,ZESTRIL) 40 MG tablet Take 40 mg by mouth daily.        Marland Kitchen lovastatin (MEVACOR) 40 MG tablet Take 80 mg by mouth at bedtime.        . nitroGLYCERIN (NITROSTAT) 0.4 MG SL tablet Place 0.4 mg under the tongue every 5 (five) minutes as needed.        . pantoprazole (PROTONIX) 40 MG tablet Take 40 mg by mouth daily.        . pramoxine-hydrocortisone cream Apply to affected area 3 -4  times daily x 14 days then as needed  30 g  1    No Known Allergies   Current Medications, Allergies, Past Medical History, Past Surgical History, Family History, and Social History were reviewed in [employer] record.     Review of Systems         See HPI - all other systems neg except as noted...  The patient complains of dyspnea on exertion and difficulty walking.  The patient denies anorexia, fever, weight loss, weight gain, vision loss, decreased hearing, hoarseness, chest pain, syncope, peripheral edema, prolonged cough, headaches, hemoptysis, abdominal pain, melena, hematochezia, severe indigestion/heartburn, hematuria, incontinence, muscle weakness, suspicious skin lesions, transient blindness, depression, unusual weight change, abnormal bleeding, enlarged lymph nodes, and angioedema.   Objective:   Physical Exam    WD, Obese, 65 y/o WM in NAD... GENERAL:  Alert & oriented; pleasant & cooperative.Marland KitchenMarland Kitchen  HEENT:  [place]/AT, EOM-wnl, PERRLA, EACs-clear, TMs-wnl, NOSE-clear, THROAT-clear & wnl. NECK:  Supple w/ fairROM; no JVD; normal carotid impulses w/o bruits; no thyromegaly or nodules palpated; no lymphadenopathy. CHEST:  Median sternotomy scar, clear to P & A; without wheezes/ rales/ or rhonchi. HEART:  Regular Rhythm; without murmurs/ rubs/ or  gallops heard. ABDOMEN:  Obese, soft & nontender; normal bowel sounds; no organomegaly or masses detected. EXT: without deformities, mild arthritic changes; no varicose veins/ +venous insuffic, tr edema... NEURO:  CN's intact; motor testing normal; sensory testing normal; gait normal & balance OK. DERM:  No lesions noted; no rash etc...   Assessment & Plan:   AR>  rec to use an Antihist Qam, Saline nasal mist during the days, and Flonase Qhs...  OSA>  He reports stable on CPAP w/o daytime hypersomnolence etc...  HBP>  Controlled on his regimen, we will switch his diuretic from HCT to Lasix at his request...  CAD, s/p CABG & subseq PCI/ stents>  Stable on BP meds + ASA/Plavix; followed by DrBensimhon...  HYPERLIPID>  FLP remains close to goals on diet + Lovastatin80, unfortunately he hasn't lost any weight...  OBESITY>  We reviewed diet + exercise prescription...  GI> GERD, Divertics, Polyps>  recent5 EGD & colon reviewed w/ the pt...  DJD/ GOUT/ LBP>  URIC level = 6.2 on the Allopurinol daily...  Anxiety>  He does not want anxiolytic Rx.Marland KitchenMarland Kitchen

## 2011-02-19 ENCOUNTER — Encounter: Payer: Self-pay | Admitting: Pulmonary Disease

## 2011-03-08 ENCOUNTER — Ambulatory Visit (INDEPENDENT_AMBULATORY_CARE_PROVIDER_SITE_OTHER): Payer: Medicare Other

## 2011-03-08 ENCOUNTER — Telehealth: Payer: Self-pay | Admitting: Pulmonary Disease

## 2011-03-08 DIAGNOSIS — Z23 Encounter for immunization: Secondary | ICD-10-CM

## 2011-03-08 MED ORDER — LISINOPRIL 40 MG PO TABS: 40.0000 mg | ORAL_TABLET | Freq: Every day | ORAL | Status: DC

## 2011-03-08 MED ORDER — FLUTICASONE PROPIONATE 50 MCG/ACT NA SUSP: 2.0000 | Freq: Every day | NASAL | Status: DC

## 2011-03-08 MED ORDER — HYOSCYAMINE SULFATE 0.125 MG SL SUBL: 0.1250 mg | SUBLINGUAL_TABLET | SUBLINGUAL | Status: DC | PRN

## 2011-03-08 MED ORDER — ALLOPURINOL 300 MG PO TABS: 300.0000 mg | ORAL_TABLET | Freq: Every day | ORAL | Status: DC

## 2011-03-08 MED ORDER — LABETALOL HCL 200 MG PO TABS: 200.0000 mg | ORAL_TABLET | Freq: Three times a day (TID) | ORAL | Status: DC

## 2011-03-08 MED ORDER — FUROSEMIDE 20 MG PO TABS: 20.0000 mg | ORAL_TABLET | Freq: Every day | ORAL | Status: DC

## 2011-03-08 MED ORDER — AMLODIPINE BESYLATE 10 MG PO TABS: 10.0000 mg | ORAL_TABLET | Freq: Every day | ORAL | Status: DC

## 2011-03-08 MED ORDER — PANTOPRAZOLE SODIUM 40 MG PO TBEC: 40.0000 mg | DELAYED_RELEASE_TABLET | Freq: Every day | ORAL | Status: DC

## 2011-03-08 MED ORDER — CLOPIDOGREL BISULFATE 75 MG PO TABS: 75.0000 mg | ORAL_TABLET | Freq: Every day | ORAL | Status: DC

## 2011-03-08 MED ORDER — HYDROCODONE-ACETAMINOPHEN 5-500 MG PO TABS: 1.0000 | ORAL_TABLET | Freq: Four times a day (QID) | ORAL | Status: DC | PRN

## 2011-03-08 MED ORDER — CLONIDINE HCL 0.1 MG PO TABS: 0.1000 mg | ORAL_TABLET | Freq: Three times a day (TID) | ORAL | Status: DC

## 2011-03-08 MED ORDER — FEXOFENADINE HCL 180 MG PO TABS: 180.0000 mg | ORAL_TABLET | Freq: Every day | ORAL | Status: DC

## 2011-03-08 MED ORDER — LOVASTATIN 40 MG PO TABS: 80.0000 mg | ORAL_TABLET | Freq: Every day | ORAL | Status: DC

## 2011-03-08 NOTE — Telephone Encounter (Signed)
Called and spoke with pt. Pt would like 90 day supply x 3 refills of all his meds.  Would like to pick up today at 11 when he comes in for flu shot or would like these mailed to his home address.  Pt aware i can't guarentee these will be ready by 11 and he is ok with this.

## 2011-03-08 NOTE — Telephone Encounter (Signed)
rx have been signed and placed in the mail to the pt per pts request

## 2011-03-12 ENCOUNTER — Telehealth: Payer: Self-pay | Admitting: Pulmonary Disease

## 2011-03-12 NOTE — Telephone Encounter (Signed)
I spoke with pt and he states his rx for hydrocodone was suppose to have been called in last week. I called and spoke with [facility] and advised them of rx. Pt aware rx was called in

## 2011-03-13 LAB — CBC
MCHC: 33.3
MCV: 87.7
Platelets: 238
RBC: 5.26
RDW: 13.6

## 2011-03-13 LAB — LIPID PANEL
Triglycerides: 134
VLDL: 27

## 2011-03-13 LAB — COMPREHENSIVE METABOLIC PANEL
BUN: 13
CO2: 30
Calcium: 10
Creatinine, Ser: 1.22
GFR calc non Af Amer: >60
Glucose, Bld: 87
Sodium: 139
Total Protein: 7.5

## 2011-03-13 LAB — POCT CARDIAC MARKERS
CKMB, poc: <1 — ABNORMAL LOW
Myoglobin, poc: 65.4
Operator id: 288331

## 2011-03-13 LAB — POCT I-STAT, CHEM 8
BUN: 17
Creatinine, Ser: 1.4
Hemoglobin: 16
Potassium: 4.4
Sodium: 138

## 2011-03-13 LAB — HEMOGLOBIN A1C
Hgb A1c MFr Bld: 5.8
Mean Plasma Glucose: 129

## 2011-03-13 LAB — DIFFERENTIAL
Eosinophils Absolute: 0.2
Lymphocytes Relative: 18
Lymphs Abs: 1.6
Monocytes Relative: 8
Neutro Abs: 6.5
Neutrophils Relative %: 71

## 2011-03-13 LAB — PROTIME-INR: Prothrombin Time: 13.1

## 2011-03-13 LAB — D-DIMER, QUANTITATIVE: D-Dimer, Quant: <0.22

## 2011-03-13 LAB — APTT: aPTT: 58 — ABNORMAL HIGH

## 2011-03-13 LAB — CK TOTAL AND CKMB (NOT AT ARMC)
CK, MB: 1.2
Total CK: 105

## 2011-03-13 LAB — TROPONIN I: Troponin I: 0.02

## 2011-04-10 ENCOUNTER — Encounter: Payer: Self-pay | Admitting: Pulmonary Disease

## 2011-04-10 ENCOUNTER — Ambulatory Visit (INDEPENDENT_AMBULATORY_CARE_PROVIDER_SITE_OTHER): Payer: Medicare Other | Admitting: Pulmonary Disease

## 2011-04-10 ENCOUNTER — Telehealth: Payer: Self-pay | Admitting: Pulmonary Disease

## 2011-04-10 VITALS — BP 118/74 | HR 84 | Temp 98.0°F | Resp 16 | Ht 69.0 in | Wt 254.0 lb

## 2011-04-10 DIAGNOSIS — E785 Hyperlipidemia, unspecified: Secondary | ICD-10-CM

## 2011-04-10 DIAGNOSIS — M1990 Unspecified osteoarthritis, unspecified site: Secondary | ICD-10-CM

## 2011-04-10 DIAGNOSIS — M7989 Other specified soft tissue disorders: Secondary | ICD-10-CM

## 2011-04-10 DIAGNOSIS — I10 Essential (primary) hypertension: Secondary | ICD-10-CM

## 2011-04-10 DIAGNOSIS — G4733 Obstructive sleep apnea (adult) (pediatric): Secondary | ICD-10-CM

## 2011-04-10 DIAGNOSIS — I2510 Atherosclerotic heart disease of native coronary artery without angina pectoris: Secondary | ICD-10-CM

## 2011-04-10 NOTE — Progress Notes (Signed)
Subjective:    Patient ID: [name], male    DOB: 01-08-1946, 65 y.o.   MRN: [id number]  HPI 65 y/o WM here for a follow up visit... he has multiple medical problems including OSA on CPAP;  HBP controlled on 5 drug regimen;  CAD- s/p CABGx4 in 2002 & followed by DrBensimhon;  Hyperlipidemia on Lovastatin;  Obesity & just can't seen to lose the weight;  GERD/ Divertics/ Polyps followed by DrPerry for GI;  DJD & hx hyperuricemia;  Anxiety...  ~  August 14, 2010:  360mo ROV- CC= left shoulder & arm pain x2-3 months & he takes Vicodin w/ relief but hasn't seen Ortho for eval & he will call DrBeane when he is ready;  note- hx hyperuricemia, on Allopurinol 300mg /d & Uric= 5.1 today...    He saw DrPerry 1/12 w/ reflux symptoms, dysphagia, abd pain & rectal bleeding> EGD & Colonoscopy 2/12 showed esoph stricture- dilated, and divertics, hems, & one adenomatous polyp removed (f/u planned 20yrs);  he remains on Protonix daily & Levsin as needed along w/ laxatives Prn.    He saw DrBensimhon 1/12 for Cardiac eval prior to his procedures> felt to be stable, BP controlled, & doing satis overall- OK to hold Plavix for the procedures;  continued on his med regimen w/ Labetolol, Catapress, Amlodipine, Lisinopril, HCTZ, ASA/ Plavix.    He remains on Lovastatin80 for his Chol> FLP today looks good x sl incr TG 153 & low HDL 31;  discussed low fat diet & exercise program (difficult for him due to arthritis).    Otherw stable> we reviewed meds & he requests 90 refills;  Fasting labs reviewed;  last CXR 3/11 showed NAD- s/p CABG, scarring right mid lung zone, spondylosis Tspine...  ~  February 13, 2011:  360mo ROV & c/o allergies, hems, arthritis/ gout > BP controlled on regimen (see below) including HCT but he wants stronger diuretic & we will switch to Lasix20;  Known CAD stable on meds + ASA/Plavix w/o angina; remains on Lovastatin for Chol w/ labs below;  Has not been able to lose weight despite diet counseling etc>  he really need to try harder...    He had an EGD 2/12 by DrPerry (sl stricture- dilated, otherw WNL), & Colonoscopy 2/12 w/ diminutive polyp, severe divertics, & int hems> path= tubular adenoma...    He saw GI/ Amy 6/12 for worsening hems w/ bleeding (on ASA/ Plavix)> Rx Balmex & Analpram 2.5% creams Tid, & f/u w/ DrPerry to see if injection vs surg is needed; Hg= 14.6     He saw DrBensimhon 5/12 for f/u HBP, CAD s/p CABG 2002 & stenting of LAD 2006 & Circ 2003 w/ drug eluting stents> doing well, no angina, exercising some, no changes made; EGK w/ SBrady, NAD...  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          PROBLEM LIST:   ALLERGIC RHINITIS (ICD-477.9) - advised  not to use Afrin etc... rather take OTC antihist (Claritin, Zyrtek, etc), Saline, FLONASE Qhs...  OBSTRUCTIVE SLEEP APNEA (ICD-327.23) - on CPAP regularly (can't locate sleep study results), doing well by his report.  HYPERTENSION (ICD-401.9) - well controlled on a 5 drug regimen: LABETALOL 200mg Tid,  CATAPRES 0.1mg Tid,  AMLODIPINE 10mg /d,  LISINOPRIL 40mg /d,  HCTZ 25mg /d... takes meds regularly and tol well... BP=118/72 today- denies HA, fatigue, visual changes, CP, palipit, dizziness, syncope, dyspnea, edema, etc... needs better diet, continue same meds. ~  CXR 3/11 is clear, WNL (heart upper limit of norm). ~  8/12:  He wants stronger diuretic & we will change HCT to LASIX 20mg /d, reminded regarding low sodium!  CORONARY ARTERY DISEASE  (ICD-414.00) - on above meds + ASA 81mg /d & PLAVIX 75mg /d... S/P CABG x4 9/02 by DrOwen... subseq PTCA/stent to native CIRC 6/03 (& occluded vein graft to LAD noted); last cath = 2/06 w/ PTCA/stent in LAD distal to LIMA touchdown, otherw no change... NuclearStressTest 9/07 showed neg w/ EF=62%... ~  3/10:  yearly ROV DrBensimhon doing well, no changes made... ~  3/11:  stable- StressTest was neg- no signif ST seg depression; ArtDopplers LEs showed norm ABIs.  HYPERLIPIDEMIA (ICD-272.4) - back on LOVASTATIN 80mg /d (40mg - 2tabsQhs) due to $$$... + FISH OIL 3/d... ~  FLP 7/08 on Lovastatin showed TChol 157, TG 127, HDL 30, LDL 101... ch to LIP40 per Cards... ~  FLP 1/09 on Lip40 showed TChol 125, Tg 105, HDL 31, LDL 73... ch back to Lovastin80 due to $$$... ~  FLP 7/09 on Lova80 showed TChol 142, TG 111, HDL 31, LDL 89... rec- continue same, get wt down. ~  FLP 1/10 showed TChol 130, TG 99, HDL 30, LDL 80... rec> get wt down or stronger statin Rx! ~  FLP 3/11 showed TChol 138, TG 96, HDL 43, LDL 76... continue same. ~  FLP 8/11 showed TChol 140, TG 145, HDL 33, LDL 78 ~  FLP 8/12 showed TChol 146, TG 114, HDL 42, LDL 81  OBESITY (ICD-278.00) ~  7/09:  he's done a better job w/ diet + exercise and weight down 13# in 37mo to 242#. ~  1/10:  unfortunately weight back up 11# over the holidays to 253#... rec- diet/ exercise discussed. ~  10/10: weight = 260# but pt notes he & wife to start diet soon!!! ~  3/11:  weight = 260# ~  8/11:  weight = 251#.Marland Kitchen. states "my wife keeps on bringing it home" ~  8/12:  Weight = 257#... We reviewed diet & exercise prescriptions!  GERD (ICD-530.81) - last EGD by DrSam 3/06 revealed severe antral gastitis.Marland Kitchen. now on PROTONIX 40mg /d, and PEPCID 20mg Qhs... ~  EGD 2/12 by DrPerry (sl stricture- dilated, otherw WNL)...  DIVERTICULOSIS OF COLON (ICD-562.10) & COLONIC POLYPS (ICD-211.3)  ~  Colonoscopy 7/05 by DrSam showed divertics, hems, otherw neg... f/u planned 69yrs. ~   Colonoscopy 2/12 w/ diminutive polyp, severe divertics, & int hems> path= tubular adenoma...  RENAL INSUFFICIENCY (ICD-588.9) - he knows to avoid NSAIDs and nephrotoxic drugs... 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  DEGENERATIVE JOINT DISEASE (ICD-715.90) & HYPERURICEMIA (ICD-790.6) - as per eval DrBeane w/ ?gout- Rx'd w/ left knee shot,  Indocin... back on ALLOPURINOL 300mg /d now... ~  labs 3/09 showed Uric= 8.7... ~  labs 7/09 showed Uric= 5.6.Marland KitchenMarland Kitchen rec- continue Allopurinol. ~  9/10: dropped timber on left foot w/ hematoma, cellulitis- Rx'd... ~  labs 9/10 showed Uric= 5.9 on Uloric40. ~  labs 3/11 showed URIC= 6.3 back on Allopurinol 300mg /d. ~  Labs 8/12 showed Uric = 6.2  BACK PAIN, LUMBAR (ICD-724.2)  ANXIETY (ICD-300.00)   Past Surgical History  Procedure Date  . Coronary artery bypass graft   . Angioplasty   . Cholecystectomy 2006    [facility]    Outpatient Encounter Prescriptions as of 04/10/2011  Medication Sig Dispense Refill  . allopurinol (ZYLOPRIM) 300 MG tablet Take 1 tablet (300 mg total) by mouth daily.  90 tablet  3  . amLODipine (NORVASC) 10 MG tablet Take 1 tablet (10 mg total) by mouth daily.  90 tablet  3  . aspirin 81 MG tablet Take 81 mg by mouth daily.        . cloNIDine (CATAPRES) 0.1 MG tablet Take 1 tablet (0.1 mg total) by mouth 3 (three) times daily.  270 tablet  3  . clopidogrel (PLAVIX) 75 MG tablet Take 1 tablet (75 mg total) by mouth daily.  90 tablet  3  . fexofenadine (ALLEGRA) 180 MG tablet Take 1 tablet (180 mg total) by mouth daily.  90 tablet  3  . fish oil-omega-3 fatty acids 1000 MG capsule Take 2 g by mouth daily.        . fluticasone (FLONASE) 50 MCG/ACT nasal spray Place 2 sprays into the nose  at bedtime.  48 g  3  . furosemide (LASIX) 20 MG tablet Take 1 tablet (20 mg total) by mouth daily.  90 tablet  3  . HYDROcodone-acetaminophen (VICODIN) 5-500 MG per tablet Take 1 tablet by mouth every 6 (six) hours as needed.  100 tablet  5  . hyoscyamine (LEVSIN SL) 0.125 MG SL tablet Place 1 tablet (0.125 mg total) under the tongue every 4 (four) hours as needed.  90 tablet  3  . labetalol (NORMODYNE) 200 MG tablet Take 1 tablet (200 mg total) by mouth 3 (three) times daily.  270 tablet  3  . lisinopril (PRINIVIL,ZESTRIL) 40 MG tablet Take 1 tablet (40 mg total) by mouth daily.  90 tablet  3  . lovastatin (MEVACOR) 40 MG tablet Take 2 tablets (80 mg total) by mouth at bedtime.  180 tablet  3  . nitroGLYCERIN (NITROSTAT) 0.4 MG SL tablet Place 0.4 mg under the tongue every 5 (five) minutes as needed.        . pantoprazole (PROTONIX) 40 MG tablet Take 1 tablet (40 mg total) by mouth daily.  90 tablet  3  . pramoxine-hydrocortisone cream Apply to affected area 3 -4  times daily x 14 days then as needed  30 g  1    No Known Allergies   Current Medications, Allergies, Past Medical History, Past Surgical History, Family History, and Social History were reviewed in [employer] record.     Review of Systems         See HPI - all other systems neg except as noted...  The patient complains of dyspnea on exertion and difficulty walking.  The patient denies anorexia, fever, weight loss, weight gain, vision loss, decreased hearing, hoarseness, chest pain, syncope, peripheral edema, prolonged cough, headaches, hemoptysis, abdominal pain, melena, hematochezia, severe indigestion/heartburn, hematuria, incontinence, muscle weakness, suspicious skin lesions, transient blindness, depression, unusual  weight change, abnormal bleeding, enlarged lymph nodes, and angioedema.   Objective:   Physical Exam    WD, Obese, 65 y/o WM in NAD... GENERAL:  Alert & oriented; pleasant &  cooperative... HEENT:  [place]/AT, EOM-wnl, PERRLA, EACs-clear, TMs-wnl, NOSE-clear, THROAT-clear & wnl. NECK:  Supple w/ fairROM; no JVD; normal carotid impulses w/o bruits; no thyromegaly or nodules palpated; no lymphadenopathy. CHEST:  Median sternotomy scar, clear to P & A; without wheezes/ rales/ or rhonchi. HEART:  Regular Rhythm; without murmurs/ rubs/ or gallops heard. ABDOMEN:  Obese, soft & nontender; normal bowel sounds; no organomegaly or masses detected. EXT: without deformities, mild arthritic changes; no varicose veins/ +venous insuffic, tr edema... NEURO:  CN's intact; motor testing normal; sensory testing normal; gait normal & balance OK. DERM:  No lesions noted; no rash etc...   Assessment & Plan:   ?Knot behind right ear> There is no knot or lesion present; he seems relieved & reassured & reiterated that his wife made him come to get it checked out; he will call if there is any further problem...   AR>  rec to use an Antihist Qam, Saline nasal mist during the days, and Flonase Qhs...  OSA>  He reports stable on CPAP w/o daytime hypersomnolence etc...  HBP>  Controlled on his regimen, & tolerating the Lasix well...  CAD, s/p CABG & subseq PCI/ stents>  Stable on BP meds + ASA/Plavix; followed by DrBensimhon...  HYPERLIPID>  FLP remains close to goals on diet + Lovastatin80, unfortunately he hasn't lost any weight...  OBESITY>  We reviewed diet + exercise prescription...  GI> GERD, Divertics, Polyps>  recent EGD & colon reviewed w/ the pt...  DJD/ GOUT/ LBP>  URIC level = 6.2 on the Allopurinol daily...  Anxiety>  He does not want anxiolytic Rx.Marland KitchenMarland Kitchen

## 2011-04-10 NOTE — Telephone Encounter (Signed)
Pt called in stating that an appt for tomorrow will be ok.  Pt stated that the swelling has gone down & does go down throughout the day, however the pain remains.  [name]

## 2011-04-10 NOTE — Telephone Encounter (Signed)
Pt aware of appt today with SN at 4pm.

## 2011-04-10 NOTE — Patient Instructions (Signed)
Today we updated your med list in our EPIC system...    Continue your current medications the same...  Watch for the development of any knot, swelling, or rash in the area & call me if you have any concerns...  Let's plan to keep your prev sched follow up appt in Feb-March.Marland KitchenMarland Kitchen

## 2011-04-10 NOTE — Telephone Encounter (Signed)
I spoke with pt and and he states he has develop a spot behind his Right ear. Pt states it looks like it is swelling and is painful. Pt states it doesn't have any heat nor puss in it. Pt states it is very sore when he first wakes up in the mornings but then the pain begins to subside as the day goes on. Pt states he is not sure if something bit him or what. Pt requesting to be seen but no available openings today or tomorrow with SN or TP. Please advise Dr. Kriste Basque, thanks  No Known Allergies   Carver Fila, CMA

## 2011-04-17 ENCOUNTER — Telehealth: Payer: Self-pay | Admitting: Pulmonary Disease

## 2011-04-17 MED ORDER — ACYCLOVIR 200 MG PO CAPS: 200.0000 mg | ORAL_CAPSULE | Freq: Three times a day (TID) | ORAL | Status: AC

## 2011-04-17 NOTE — Telephone Encounter (Signed)
Spoke with the pt and he is requesting a refill on acyclovir 200mg  tablets three times a day. I do not see this on the pt med list in Epic, but it is on list in centricity but does nto show it was filled by Dr. Kriste Basque. Please advise if ok to send refill. Carron Curie, CMA No Known Allergies

## 2011-04-17 NOTE — Telephone Encounter (Signed)
Per SN---ok to fill the acyclovir  200mg     1 tablet tid  #90  And refill prn.  thanks

## 2011-04-17 NOTE — Telephone Encounter (Signed)
Pt aware that we have sent Rx to [facility] as okayed per [facility].

## 2011-04-24 ENCOUNTER — Other Ambulatory Visit: Payer: Self-pay | Admitting: Physician Assistant

## 2011-05-15 ENCOUNTER — Ambulatory Visit (INDEPENDENT_AMBULATORY_CARE_PROVIDER_SITE_OTHER): Payer: Medicare Other | Admitting: Adult Health

## 2011-05-15 ENCOUNTER — Encounter: Payer: Self-pay | Admitting: Adult Health

## 2011-05-15 VITALS — BP 112/70 | HR 53 | Temp 98.3°F | Ht 69.0 in | Wt 256.0 lb

## 2011-05-15 DIAGNOSIS — IMO0002 Reserved for concepts with insufficient information to code with codable children: Secondary | ICD-10-CM

## 2011-05-15 DIAGNOSIS — L723 Sebaceous cyst: Secondary | ICD-10-CM

## 2011-05-15 MED ORDER — DOXYCYCLINE HYCLATE 100 MG PO TABS: 100.0000 mg | ORAL_TABLET | Freq: Two times a day (BID) | ORAL | Status: AC

## 2011-05-15 NOTE — Progress Notes (Signed)
Subjective:    Patient ID: [name], male    DOB: 08/09/1945, 65 y.o.   MRN: [id number]  HPI 65 y/o WM here with known hx of OSA on CPAP;  HBP controlled on 5 drug regimen;  CAD- s/p CABGx4 in 2002 & followed by DrBensimhon;  Hyperlipidemia on Lovastatin;  Obesity & just can't seen to lose the weight;  GERD/ Divertics/ Polyps followed by DrPerry for GI;  DJD & hx hyperuricemia;  Anxiety...  ~  August 14, 2010:  69mo ROV- CC= left shoulder & arm pain x2-3 months & he takes Vicodin w/ relief but hasn't seen Ortho for eval & he will call DrBeane when he is ready;  note- hx hyperuricemia, on Allopurinol 300mg /d & Uric= 5.1 today...    He saw DrPerry 1/12 w/ reflux symptoms, dysphagia, abd pain & rectal bleeding> EGD & Colonoscopy 2/12 showed esoph stricture- dilated, and divertics, hems, & one adenomatous polyp removed (f/u planned 67yrs);  he remains on Protonix daily & Levsin as needed along w/ laxatives Prn.    He saw DrBensimhon 1/12 for Cardiac eval prior to his procedures> felt to be stable, BP controlled, & doing satis overall- OK to hold Plavix for the procedures;  continued on his med regimen w/ Labetolol, Catapress, Amlodipine, Lisinopril, HCTZ, ASA/ Plavix.    He remains on Lovastatin80 for his Chol> FLP today looks good x sl incr TG 153 & low HDL 31;  discussed low fat diet & exercise program (difficult for him due to arthritis).    Otherw stable> we reviewed meds & he requests 90 refills;  Fasting labs reviewed;  last CXR 3/11 showed NAD- s/p CABG, scarring right mid lung zone, spondylosis Tspine...  ~  February 13, 2011:  69mo ROV & c/o allergies, hems, arthritis/ gout > BP controlled on regimen (see below) including HCT but he wants stronger diuretic & we will switch to Lasix20;  Known CAD stable on meds + ASA/Plavix w/o angina; remains on Lovastatin for Chol w/ labs below;  Has not been able to lose weight despite diet counseling etc> he really need to try harder...    He had an EGD  2/12 by DrPerry (sl stricture- dilated, otherw WNL), & Colonoscopy 2/12 w/ diminutive polyp, severe divertics, & int hems> path= tubular adenoma...    He saw GI/ Amy 6/12 for worsening hems w/ bleeding (on ASA/ Plavix)> Rx Balmex & Analpram 2.5% creams Tid, & f/u w/ DrPerry to see if injection vs surg is needed; Hg= 14.6     He saw DrBensimhon 5/12 for f/u HBP, CAD s/p CABG 2002 & stenting of LAD 2006 & Circ 2003 w/ drug eluting stents> doing well, no angina, exercising some, no changes made; EGK w/ SBrady, NAD...  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  05/15/2011 Acute OV  Pt c/o cyst on back with soreness x 1 week with changes in size. Noticed on Right upper  mid  back , sore to touch, no fever. No previous episode. No drainage or redness. No known injury. No meds used for treatment. No recent travel, abx use or new skin products.          PROBLEM LIST:   ALLERGIC RHINITIS (ICD-477.9) - advised not to use Afrin etc... rather take OTC antihist (Claritin, Zyrtek, etc), Saline, FLONASE Qhs...  OBSTRUCTIVE SLEEP APNEA (ICD-327.23) - on CPAP regularly (can't locate sleep study results), doing well by his report.  HYPERTENSION (ICD-401.9) - well controlled on a 5 drug regimen: LABETALOL 200mg Tid,  CATAPRES 0.1mg Tid,  AMLODIPINE 10mg /d,  LISINOPRIL 40mg /d,  HCTZ 25mg /d... takes meds regularly and tol well... BP=118/72 today- denies HA, fatigue, visual changes, CP, palipit, dizziness, syncope, dyspnea, edema,  etc... needs better diet, continue same meds. ~  CXR 3/11 is clear, WNL (heart upper limit of norm). ~  8/12:  He wants stronger diuretic & we will change HCT to LASIX 20mg /d, reminded regarding low sodium!  CORONARY ARTERY DISEASE (ICD-414.00) - on above meds + ASA 81mg /d & PLAVIX 75mg /d... S/P CABG x4 9/02 by DrOwen... subseq PTCA/stent to native CIRC 6/03 (& occluded vein graft to LAD noted); last cath = 2/06 w/ PTCA/stent in LAD distal to LIMA touchdown, otherw no change... NuclearStressTest 9/07 showed neg w/ EF=62%... ~  3/10:  yearly ROV DrBensimhon doing well, no changes made... ~  3/11:  stable- StressTest was neg- no signif ST seg depression; ArtDopplers LEs showed norm ABIs.  HYPERLIPIDEMIA (ICD-272.4) - back on LOVASTATIN 80mg /d (40mg - 2tabsQhs) due to $$$... + FISH OIL 3/d... ~  FLP 7/08 on Lovastatin showed TChol 157, TG 127, HDL 30, LDL 101... ch to LIP40 per Cards... ~  FLP 1/09 on Lip40 showed TChol 125, Tg 105, HDL 31, LDL 73... ch back to Lovastin80 due to $$$... ~  FLP 7/09 on Lova80 showed TChol 142, TG 111, HDL 31, LDL 89... rec- continue same, get wt down. ~  FLP 1/10 showed TChol 130, TG 99, HDL 30, LDL 80... rec> get wt down or stronger statin Rx! ~  FLP 3/11 showed TChol 138, TG 96, HDL 43, LDL 76... continue same. ~  FLP 8/11 showed TChol 140, TG 145, HDL 33, LDL 78 ~  FLP 8/12 showed TChol 146, TG 114, HDL 42, LDL 81  OBESITY (ICD-278.00) ~  7/09:  he's done a better job w/ diet + exercise and weight down 13# in 10mo to 242#. ~  1/10:  unfortunately weight back up 11# over the holidays to 253#... rec- diet/ exercise discussed. ~  10/10: weight = 260# but pt notes he & wife to start diet soon!!! ~  3/11:  weight = 260# ~  8/11:  weight = 251#.Marland Kitchen. states "my wife keeps on bringing it home" ~  8/12:  Weight = 257#... We reviewed diet & exercise prescriptions!  GERD (ICD-530.81) - last EGD by DrSam 3/06 revealed severe antral gastitis.Marland Kitchen. now on PROTONIX 40mg /d, and  PEPCID 20mg Qhs... ~  EGD 2/12 by DrPerry (sl stricture- dilated, otherw WNL)...  DIVERTICULOSIS OF COLON (ICD-562.10) & COLONIC POLYPS (ICD-211.3)  ~  Colonoscopy 7/05 by DrSam showed divertics, hems, otherw neg... f/u planned 57yrs. ~  Colonoscopy 2/12 w/ diminutive polyp, severe divertics, & int hems> path= tubular adenoma...  RENAL INSUFFICIENCY (ICD-588.9) - he knows to avoid NSAIDs and nephrotoxic drugs... 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  DEGENERATIVE JOINT DISEASE (ICD-715.90) & HYPERURICEMIA (ICD-790.6) - as per eval DrBeane w/ ?gout- Rx'd w/ left knee shot, Indocin... back on ALLOPURINOL 300mg /d now... ~  labs 3/09 showed Uric= 8.7... ~  labs 7/09 showed Uric= 5.6.Marland KitchenMarland Kitchen rec- continue Allopurinol. ~  9/10: dropped timber on left foot w/ hematoma, cellulitis- Rx'd... ~  labs 9/10 showed Uric= 5.9 on Uloric40. ~  labs 3/11 showed URIC= 6.3 back on Allopurinol 300mg /d. ~  Labs 8/12 showed Uric = 6.2  BACK PAIN, LUMBAR (ICD-724.2)  ANXIETY (ICD-300.00)   Past Surgical History  Procedure Date  . Coronary artery bypass graft   . Angioplasty   . Cholecystectomy 2006    [facility]    Outpatient Encounter Prescriptions as of 05/15/2011  Medication Sig Dispense Refill  . acyclovir (ZOVIRAX) 200 MG capsule Take by mouth. As directed        . allopurinol (ZYLOPRIM) 300 MG tablet Take 1 tablet (300 mg total) by mouth daily.  90 tablet  3  . amLODipine (NORVASC) 10 MG tablet Take 1 tablet (10 mg total) by mouth daily.  90 tablet  3  . aspirin 81 MG tablet Take 81 mg by mouth daily.        . cloNIDine (CATAPRES) 0.1 MG tablet Take 1 tablet (0.1 mg total) by mouth 3 (three) times daily.  270 tablet  3  . clopidogrel (PLAVIX) 75 MG tablet  Take 1 tablet (75 mg total) by mouth daily.  90 tablet  3  . fexofenadine (ALLEGRA) 180 MG tablet Take 1 tablet (180 mg total) by mouth daily.  90 tablet  3  . fish oil-omega-3 fatty acids 1000 MG capsule Take 2 g by mouth daily.        . fluticasone (FLONASE) 50 MCG/ACT nasal spray Place 2 sprays into the nose at bedtime.  48 g  3  . furosemide (LASIX) 20 MG tablet Take 1 tablet (20 mg total) by mouth daily.  90 tablet  3  . HYDROcodone-acetaminophen (VICODIN) 5-500 MG per tablet Take 1 tablet by mouth every 6 (six) hours as needed.  100 tablet  5  . hyoscyamine (LEVSIN SL) 0.125 MG SL tablet Place 1 tablet (0.125 mg total) under the tongue every 4 (four) hours as needed.  90 tablet  3  . labetalol (NORMODYNE) 200 MG tablet Take 1 tablet (200 mg total) by mouth 3 (three) times daily.  270 tablet  3  . lisinopril (PRINIVIL,ZESTRIL) 40 MG tablet Take 1 tablet (40 mg total) by mouth daily.  90 tablet  3  . loratadine (CLARITIN) 10 MG tablet Take 10 mg by mouth daily.        Marland Kitchen lovastatin (MEVACOR) 40 MG tablet Take 2 tablets (80 mg total) by mouth at bedtime.  180 tablet  3  . nitroGLYCERIN (NITROSTAT) 0.4 MG SL tablet Place 0.4 mg under the tongue every 5 (five) minutes as needed.        . pantoprazole (PROTONIX) 40 MG tablet Take 1 tablet (40 mg total) by mouth daily.  90 tablet  3  . pramoxine-hydrocortisone cream Apply to affected area 3 -4  times daily x 14 days then as needed  30 g  1    No Known Allergies   Current Medications, Allergies, Past Medical History, Past Surgical History, Family History, and Social  History were reviewed in  Link electronic medical record.     Review of Systems         See HPI - all other systems neg except as noted...  Constitutional:   No  weight loss, night sweats,  Fevers, chills,  +fatigue, or  lassitude.  HEENT:   No headaches,  Difficulty swallowing,  Tooth/dental problems, or  Sore throat,                No sneezing, itching, ear ache,  nasal congestion, post nasal drip,   CV:  No chest pain,  Orthopnea, PND, swelling in lower extremities, anasarca, dizziness, palpitations, syncope.   GI  No heartburn, indigestion, abdominal pain, nausea, vomiting, diarrhea, change in bowel habits, loss of appetite, bloody stools.   Resp: No shortness of breath with exertion or at rest.  No excess mucus, no productive cough,  No non-productive cough,  No coughing up of blood.  No change in color of mucus.  No wheezing.  No chest wall deformity  Skin: tender knot on back   GU: no dysuria, change in color of urine, no urgency or frequency.  No flank pain, no hematuria   MS:  No joint pain or swelling.  No decreased range of motion.  No back pain.  Psych:  No change in mood or affect. No depression or anxiety.  No memory loss.       Objective:   Physical Exam    WD, Obese, 65 y/o WM in NAD... GENERAL:  Alert & oriented; pleasant & cooperative... HEENT:  [place]/AT, EOM-wnl, PERRLA, EACs-clear, TMs-wnl, NOSE-clear, THROAT-clear & wnl. NECK:  Supple w/ fairROM; no JVD; normal carotid impulses w/o bruits; no thyromegaly or nodules palpated; no lymphadenopathy. CHEST:  Median sternotomy scar, clear to P & A; without wheezes/ rales/ or rhonchi. HEART:  Regular Rhythm; without murmurs/ rubs/ or gallops heard. ABDOMEN:  Obese, soft & nontender; normal bowel sounds; no organomegaly or masses detected. EXT: without deformities, mild arthritic changes; no varicose veins/ +venous insuffic, tr edema... NEURO:    sensory testing normal; gait normal & balance OK. DERM: along upper right mid back with small soft nodule ~0.5cm , no redness, not fluncutant. Mild tenderness.    Assessment & Plan:

## 2011-05-15 NOTE — Patient Instructions (Signed)
Doxycycline 100mg  Twice daily  For 7 days  Warm compresses to cyst Three times a day   We will refer you to surgeon to evaluate if does not resolve Please contact office for sooner follow up if symptoms do not improve or worsen or seek emergency care

## 2011-05-17 DIAGNOSIS — L723 Sebaceous cyst: Secondary | ICD-10-CM | POA: Insufficient documentation

## 2011-05-17 NOTE — Assessment & Plan Note (Signed)
Right post. Back sebaceous cyst- ? Early infection  Plan:  Doxycycline 100mg  Twice daily  For 7 days  Warm compresses to cyst Three times a day   We will refer you to surgeon to evaluate if does not resolve Please contact office for sooner follow up if symptoms do not improve or worsen or seek emergency care

## 2011-05-31 ENCOUNTER — Encounter (INDEPENDENT_AMBULATORY_CARE_PROVIDER_SITE_OTHER): Payer: Self-pay | Admitting: Surgery

## 2011-05-31 ENCOUNTER — Ambulatory Visit (INDEPENDENT_AMBULATORY_CARE_PROVIDER_SITE_OTHER): Payer: Medicare Other | Admitting: Surgery

## 2011-05-31 DIAGNOSIS — L723 Sebaceous cyst: Secondary | ICD-10-CM

## 2011-05-31 NOTE — Progress Notes (Addendum)
Re:   [name] DOB:   07/02/1945 MRN:   [id number]  ASSESSMENT AND PLAN: 1.  Cyst on back.  Inflamed.  I&D cyst of back.  I will see him back next week to check incision.  Then plan excision of residual sac in 6 to 8 weeks.  2.  OSA, on CPAP. 3.  Hypertension. 4.  CAD.  Sees Dr. Algis Downs. Bensimohn. 5.  Hyperlipidemia. 6.  Morbid Obesity, BMI 37.8. 7.  On plavix.  No chief complaint on file.  REFERRING PHYSICIAN: Michele Mcalpine, MD, MD  HISTORY OF PRESENT ILLNESS: EMIT KUENZEL is a 65 y.o. (DOB: 1946/04/26)  white male whose primary care physician is NADEL,SCOTT M, MD, MD and comes to me today for a cyst on his midback.  He has noticed this for a couple of months.  I think he has been through one course of antibiotics to treat it.  It sits right on his spine, so whenever he lays against anything, it hurts or gets irritated.    Past Medical History  Diagnosis Date  . CAD (coronary artery disease)     s/p CABG  . Allergic rhinitis   . OSA (obstructive sleep apnea)   . HTN (hypertension)   . Hyperlipidemia   . Obesity   . GERD (gastroesophageal reflux disease)   . Diverticulosis of colon   . Personal history of colonic polyps 07/2010    tublar adenoma  . Renal insufficiency   . Organic impotence   . DJD (degenerative joint disease)   . Lumbar back pain   . Hyperuricemia   . Anxiety   . Hemorrhoids   . Chronic gastritis   . Herpes   . Esophageal stricture       Past Surgical History  Procedure Date  . Coronary artery bypass graft   . Angioplasty   . Cholecystectomy 2006    [facility]      Current Outpatient Prescriptions  Medication Sig Dispense Refill  . amLODipine (NORVASC) 10 MG tablet Take 1 tablet (10 mg total) by mouth daily.  90 tablet  3  . aspirin 81 MG tablet Take 81 mg by mouth daily.        . cloNIDine (CATAPRES) 0.1 MG tablet Take 1 tablet (0.1 mg total) by mouth 3 (three) times daily.  270 tablet  3  . clopidogrel (PLAVIX) 75 MG tablet Take 1  tablet (75 mg total) by mouth daily.  90 tablet  3  . famotidine (PEPCID AC) 10 MG chewable tablet Chew 10 mg by mouth 2 (two) times daily.        . fexofenadine (ALLEGRA) 180 MG tablet Take 1 tablet (180 mg total) by mouth daily.  90 tablet  3  . fish oil-omega-3 fatty acids 1000 MG capsule Take 2 g by mouth daily.        . furosemide (LASIX) 10 MG/ML solution Take by mouth daily.        Marland Kitchen HYDROcodone-acetaminophen (VICODIN) 5-500 MG per tablet Take 1 tablet by mouth every 6 (six) hours as needed.  100 tablet  5  . hyoscyamine (LEVSIN SL) 0.125 MG SL tablet Place 1 tablet (0.125 mg total) under the tongue every 4 (four) hours as needed.  90 tablet  3  . labetalol (NORMODYNE) 200 MG tablet Take 1 tablet (200 mg total) by mouth 3 (three) times daily.  270 tablet  3  . lisinopril (PRINIVIL,ZESTRIL) 40 MG tablet Take 1 tablet (40 mg total) by mouth  daily.  90 tablet  3  . lovastatin (MEVACOR) 40 MG tablet Take 2 tablets (80 mg total) by mouth at bedtime.  180 tablet  3  . nitroGLYCERIN (NITROSTAT) 0.4 MG SL tablet Place 0.4 mg under the tongue every 5 (five) minutes as needed.        . pantoprazole (PROTONIX) 40 MG tablet Take 1 tablet (40 mg total) by mouth daily.  90 tablet  3  . acyclovir (ZOVIRAX) 200 MG capsule          No Known Allergies  REVIEW OF SYSTEMS: Skin:  No history of rash.  No history of abnormal moles. Infection:  No history of hepatitis or HIV.  No history of MRSA. Neurologic:  No history of stroke.  No history of seizure.  No history of headaches. Cardiac:  Hypertension. CAD.  Sees Dr. Eula Fried for cardiology. Pulmonary:  OSA, on CPAP.  Endocrine:  No diabetes. No thyroid disease. Gastrointestinal:  Lap chole by Dr. Haroldine Laws 03/21/2006.  Diverticulosis. Urologic:  No history of kidney stones.  No history of bladder infections. Musculoskeletal:  No history of joint or back disease. Hematologic:  No bleeding disorder.  No history of anemia.  Not  anticoagulated. Psycho-social:  The patient is oriented.   The patient has no obvious psychologic or social impairment to understanding our conversation and plan.  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  PHYSICAL EXAM: BP 116/80  Pulse 68  Temp 97.3 F (36.3 C)  Resp 16  Ht 5\' 9"  (1.753 m)  Wt 256 lb (116.121 kg)  BMI 37.80 kg/m2  General: WN obese WM, who is alert and generally healthy appearing.  HEENT: Normal. Pupils equal. Good dentition.  Lungs: Clear to auscultation and symmetric breath sounds. Heart:  RRR. No murmur or rub. Abdomen: Soft. No mass. Obese. Back:  3 cm inflamed cyst in mid back. Extremities:  Good strength and ROM  in upper and lower extremities. Neurologic:  Grossly intact to motor and sensory function. Psychiatric: Has normal mood and affect. Behavior is normal.   DATA REVIEWED: Notes from Dr. Jodelle Green office.  Procedure:  While in office, I cleaned the area with alcohol.  I injected 4 cc of 1% xylocaine with epi.  Then did a I&D of the cyst.  I tried to clean out the contents of the cyst.  I then packed it with gauze.  Ovidio Kin, MD,  [facility] [address] [place]  [address]   [place], [place]    [address] Phone:  [phone] FAX:  [phone]

## 2011-05-31 NOTE — Patient Instructions (Signed)
1.  Remove packing tomorrow morning.  2.  Clean with soap and water 2 times per day.  Replace dressing as necessary.

## 2011-06-06 ENCOUNTER — Telehealth: Payer: Self-pay | Admitting: Pulmonary Disease

## 2011-06-06 NOTE — Telephone Encounter (Signed)
I spoke with pt and he states he would like to p/u a list of his medications to have for his records. I advised pt will leave upfront for p/u. Pt voiced his understanding

## 2011-06-07 ENCOUNTER — Ambulatory Visit (INDEPENDENT_AMBULATORY_CARE_PROVIDER_SITE_OTHER): Payer: Medicare Other | Admitting: Surgery

## 2011-06-07 ENCOUNTER — Encounter (INDEPENDENT_AMBULATORY_CARE_PROVIDER_SITE_OTHER): Payer: Self-pay | Admitting: Surgery

## 2011-06-07 DIAGNOSIS — L723 Sebaceous cyst: Secondary | ICD-10-CM

## 2011-06-07 NOTE — Progress Notes (Signed)
[facility]  Re:   [name] DOB:   Oct 01, 1945 MRN:   [id number]  ASSESSMENT AND PLAN: 1.  Cyst on back.  Inflamed.  Cyst looks much better.  I'll see him back in 6 to 8 weeks, then probably schedule an excision of the residual cyst.  Note:  He has a smaller cyst on his left shoulder that he want excised at the same time.  2.  OSA, on CPAP. 3.  Hypertension. 4.  CAD.  Sees Dr. Algis Downs. Bensimohn. 5.  Hyperlipidemia. 6.  Morbid Obesity, BMI 37.8. 7.  On plavix.   REFERRING PHYSICIAN: Michele Mcalpine, MD, MD  HISTORY OF PRESENT ILLNESS: [name] is a 65 y.o. (DOB: [birth year])  white male whose primary care physician is NADEL,SCOTT M, MD, MD and comes for f/u of an infected cyst on his midback.  The cyst/inflammation is much better.    Past Medical History  Diagnosis Date  . CAD (coronary artery disease)     s/p CABG  . Allergic rhinitis   . OSA (obstructive sleep apnea)   . HTN (hypertension)   . Hyperlipidemia   . Obesity   . GERD (gastroesophageal reflux disease)   . Diverticulosis of colon   . Personal history of colonic polyps 07/2010    tublar adenoma  . Renal insufficiency   . Organic impotence   . DJD (degenerative joint disease)   . Lumbar back pain   . Hyperuricemia   . Anxiety   . Hemorrhoids   . Chronic gastritis   . Herpes   . Esophageal stricture       Current Outpatient Prescriptions  Medication Sig Dispense Refill  . acyclovir (ZOVIRAX) 200 MG capsule Take 200 mg by mouth 3 (three) times daily.       Marland Kitchen amLODipine (NORVASC) 10 MG tablet Take 1 tablet (10 mg total) by mouth daily.  90 tablet  3  . aspirin 81 MG tablet Take 81 mg by mouth daily.        . cloNIDine (CATAPRES) 0.1 MG tablet Take 1 tablet (0.1 mg total) by mouth 3 (three) times daily.  270 tablet  3  . clopidogrel (PLAVIX) 75 MG tablet Take 1 tablet (75 mg total) by mouth daily.  90 tablet  3  . famotidine (PEPCID AC) 10 MG chewable tablet Chew 10 mg by mouth 2 (two) times  daily.        . fexofenadine (ALLEGRA) 180 MG tablet Take 1 tablet (180 mg total) by mouth daily.  90 tablet  3  . fish oil-omega-3 fatty acids 1000 MG capsule Take 2 g by mouth daily.        . furosemide (LASIX) 10 MG/ML solution Take by mouth daily.        Marland Kitchen HYDROcodone-acetaminophen (VICODIN) 5-500 MG per tablet Take 1 tablet by mouth every 6 (six) hours as needed.  100 tablet  5  . hyoscyamine (LEVSIN SL) 0.125 MG SL tablet Place 1 tablet (0.125 mg total) under the tongue every 4 (four) hours as needed.  90 tablet  3  . labetalol (NORMODYNE) 200 MG tablet Take 1 tablet (200 mg total) by mouth 3 (three) times daily.  270 tablet  3  . lisinopril (PRINIVIL,ZESTRIL) 40 MG tablet Take 1 tablet (40 mg total) by mouth daily.  90 tablet  3  . lovastatin (MEVACOR) 40 MG tablet Take 2 tablets (80 mg total) by mouth at bedtime.  180 tablet  3  . nitroGLYCERIN (  NITROSTAT) 0.4 MG SL tablet Place 0.4 mg under the tongue every 5 (five) minutes as needed.        . pantoprazole (PROTONIX) 40 MG tablet Take 1 tablet (40 mg total) by mouth daily.  90 tablet  3     No Known Allergies  REVIEW OF SYSTEMS: Skin:  No history of rash.  No history of abnormal moles. Cardiac:  Hypertension. CAD.  Sees Dr. Eula Fried for cardiology. Pulmonary:  OSA, on CPAP.  Endocrine:  No diabetes. No thyroid disease. Gastrointestinal:  Lap chole by Dr. Haroldine Laws 03/21/2006.  Diverticulosis.  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  PHYSICAL EXAM: BP 124/84  Pulse 68  Resp 16  Ht 5\' 7"  (1.702 m)  Wt 256 lb (116.121 kg)  BMI 40.10 kg/m2  General: WN obese WM, who is alert and generally healthy appearing.   Back:  Mid back cyst looks much better.  He has a smaller cyst on his left shoulder.   Ovidio Kin, MD,  [facility] [address] [place]  [address]   [place], [place]    [address] Phone:  [phone] FAX:  [phone]

## 2011-06-25 ENCOUNTER — Telehealth: Payer: Self-pay | Admitting: Pulmonary Disease

## 2011-06-25 DIAGNOSIS — M79604 Pain in right leg: Secondary | ICD-10-CM

## 2011-06-25 NOTE — Telephone Encounter (Signed)
Pt is aware of referral. Order has been placed

## 2011-06-25 NOTE — Telephone Encounter (Signed)
I spoke with pt and he c/o right hip and leg pain x 3 months. Pt states it has gradually been getting worse. He rates his pain 8/10 now. He is taking aspirin and using the heating pad. He believes it may be a pinched nerve bc he does not recall hitting it on anything. Pain is worse at night. Pt is requesting recs from Dr. Kriste Basque. Please advise thanks  No Known Allergies

## 2011-06-25 NOTE — Telephone Encounter (Signed)
Per TP---will need  appt with Ortho for eval of leg pain.  thanks

## 2011-06-26 ENCOUNTER — Other Ambulatory Visit: Payer: Self-pay | Admitting: Orthopedic Surgery

## 2011-06-26 DIAGNOSIS — M79604 Pain in right leg: Secondary | ICD-10-CM

## 2011-06-26 DIAGNOSIS — M545 Low back pain: Secondary | ICD-10-CM

## 2011-06-29 ENCOUNTER — Ambulatory Visit: Payer: Medicare Other | Admitting: Pulmonary Disease

## 2011-06-30 ENCOUNTER — Ambulatory Visit
Admission: RE | Admit: 2011-06-30 | Discharge: 2011-06-30 | Disposition: A | Discharge status: Home / Self Care | Payer: Medicare Other | Source: Ambulatory Visit | Attending: Orthopedic Surgery | Admitting: Orthopedic Surgery

## 2011-06-30 DIAGNOSIS — M79604 Pain in right leg: Secondary | ICD-10-CM

## 2011-06-30 DIAGNOSIS — M545 Low back pain: Secondary | ICD-10-CM

## 2011-07-02 ENCOUNTER — Telehealth: Payer: Self-pay | Admitting: Pulmonary Disease

## 2011-07-02 MED ORDER — LOVASTATIN 40 MG PO TABS: 80.0000 mg | ORAL_TABLET | Freq: Every day | ORAL | Status: DC

## 2011-07-02 MED ORDER — AMLODIPINE BESYLATE 10 MG PO TABS: 10.0000 mg | ORAL_TABLET | Freq: Every day | ORAL | Status: DC

## 2011-07-02 MED ORDER — LABETALOL HCL 200 MG PO TABS: 200.0000 mg | ORAL_TABLET | Freq: Three times a day (TID) | ORAL | Status: DC

## 2011-07-02 MED ORDER — CLOPIDOGREL BISULFATE 75 MG PO TABS: 75.0000 mg | ORAL_TABLET | Freq: Every day | ORAL | Status: DC

## 2011-07-02 MED ORDER — PANTOPRAZOLE SODIUM 40 MG PO TBEC: 40.0000 mg | DELAYED_RELEASE_TABLET | Freq: Every day | ORAL | Status: DC

## 2011-07-02 MED ORDER — ACYCLOVIR 200 MG PO CAPS: 200.0000 mg | ORAL_CAPSULE | Freq: Three times a day (TID) | ORAL | Status: DC

## 2011-07-02 MED ORDER — FUROSEMIDE 20 MG PO TABS: 20.0000 mg | ORAL_TABLET | Freq: Every day | ORAL | Status: DC

## 2011-07-02 MED ORDER — FEXOFENADINE HCL 180 MG PO TABS: 180.0000 mg | ORAL_TABLET | Freq: Every day | ORAL | Status: AC

## 2011-07-02 MED ORDER — LISINOPRIL 40 MG PO TABS: 40.0000 mg | ORAL_TABLET | Freq: Every day | ORAL | Status: DC

## 2011-07-02 MED ORDER — CLONIDINE HCL 0.1 MG PO TABS: 0.1000 mg | ORAL_TABLET | Freq: Three times a day (TID) | ORAL | Status: DC

## 2011-07-02 MED ORDER — HYOSCYAMINE SULFATE 0.125 MG SL SUBL: 0.1250 mg | SUBLINGUAL_TABLET | SUBLINGUAL | Status: DC | PRN

## 2011-07-02 MED ORDER — HYDROCODONE-ACETAMINOPHEN 5-500 MG PO TABS: 1.0000 | ORAL_TABLET | Freq: Four times a day (QID) | ORAL | Status: DC | PRN

## 2011-07-02 NOTE — Telephone Encounter (Signed)
Pt is requesting 90 day supply for all medications and needs this faxed to [facility] at [phone]. Prescriptions need to include:  Name, dob, member # and phone #. Member # is [id number].  Prescriptions printed, signed by Sn and faxed to [facility] at [phone] as pt requested. Pt aware.

## 2011-07-04 ENCOUNTER — Telehealth: Payer: Self-pay | Admitting: Internal Medicine

## 2011-07-04 NOTE — Telephone Encounter (Signed)
Will forward to dr Bensimhon's nurse.

## 2011-07-04 NOTE — Telephone Encounter (Signed)
New msg: Pt wife calling wanting to speak with MD regarding pt getting cortisone shot considering pt is taking blood thinner (plavix). Please return pt wife call to discuss further.

## 2011-07-04 NOTE — Telephone Encounter (Signed)
Can f/u with another of our docs.  OK to come off Plavix for injection (if needed). Need to understand there is very tiny risk of clotting a stent off Plavix but this risk is really very small and I feel comfortable having his stop for a week or two.

## 2011-07-04 NOTE — Telephone Encounter (Signed)
Does not need to f/u in heart failure clinic, will send to [facility] and Dr Gala Romney

## 2011-07-05 ENCOUNTER — Encounter: Payer: Self-pay | Admitting: *Deleted

## 2011-07-05 NOTE — Telephone Encounter (Signed)
Faxed letter to dr Alvester Morin at [phone]/ phone [phone]

## 2011-07-05 NOTE — Telephone Encounter (Signed)
Fu call °Patient calling back about this issue °

## 2011-07-05 NOTE — Telephone Encounter (Signed)
Called and got verbal clarification from Dr Teressa Lower, see letter.

## 2011-07-06 ENCOUNTER — Telehealth: Payer: Self-pay | Admitting: Internal Medicine

## 2011-07-06 ENCOUNTER — Telehealth (INDEPENDENT_AMBULATORY_CARE_PROVIDER_SITE_OTHER): Payer: Self-pay

## 2011-07-06 NOTE — Telephone Encounter (Signed)
Faxed letter again.  Called Dr [place] Blas office and verified receiving.  Advised wife

## 2011-07-06 NOTE — Telephone Encounter (Signed)
New Problem   Patient wife calling to f/u regarding requested documentation, which is holding up patient's procedure with another physician.  Please return call to patient wife at home.

## 2011-07-09 NOTE — Telephone Encounter (Signed)
I notified the patient that I spoke to Dr Ezzard Standing.  He prefers to wait at least 2-3 weeks before he operates after his injections of steroids due to risk of infection.  He would just need to restart the plavix and we will need to pick another time for him to go off the plavix.  We will see him on 1/30 and plan from there.

## 2011-07-11 ENCOUNTER — Telehealth: Payer: Self-pay | Admitting: Pulmonary Disease

## 2011-07-11 ENCOUNTER — Other Ambulatory Visit: Payer: Self-pay | Admitting: Orthopedic Surgery

## 2011-07-11 DIAGNOSIS — M479 Spondylosis, unspecified: Secondary | ICD-10-CM

## 2011-07-11 NOTE — Telephone Encounter (Signed)
[name] has faxed back clarification on rx so will sign off message

## 2011-07-11 NOTE — Telephone Encounter (Signed)
I have called # listed above and was on hold x 5 minutes and then an option came on that "all representatives is currently assisting other patients and if you would like to leave a VM please press 1". So i pressed 1 to have someone call back.

## 2011-07-13 ENCOUNTER — Other Ambulatory Visit: Payer: Medicare Other

## 2011-07-13 ENCOUNTER — Ambulatory Visit
Admission: RE | Admit: 2011-07-13 | Discharge: 2011-07-13 | Disposition: A | Discharge status: Home / Self Care | Payer: Medicare Other | Source: Ambulatory Visit | Attending: Orthopedic Surgery | Admitting: Orthopedic Surgery

## 2011-07-13 DIAGNOSIS — M479 Spondylosis, unspecified: Secondary | ICD-10-CM

## 2011-07-13 MED ORDER — METHYLPREDNISOLONE ACETATE 40 MG/ML INJ SUSP (RADIOLOG
120.0000 mg | Freq: Once | INTRAMUSCULAR | Status: AC
  Administered 2011-07-13: 120 mg via EPIDURAL

## 2011-07-13 MED ORDER — IOHEXOL 180 MG/ML  SOLN
1.0000 mL | Freq: Once | INTRAMUSCULAR | Status: AC | PRN
  Administered 2011-07-13: 1 mL via EPIDURAL

## 2011-07-16 ENCOUNTER — Telehealth: Payer: Self-pay | Admitting: Pulmonary Disease

## 2011-07-16 MED ORDER — PREDNISONE (PAK) 5 MG PO TABS: ORAL_TABLET | ORAL | Status: DC

## 2011-07-16 MED ORDER — HYDROXYZINE HCL 10 MG PO TABS: ORAL_TABLET | ORAL | Status: DC

## 2011-07-16 NOTE — Telephone Encounter (Signed)
I spoke with [name] and he states he got a cortisone shot in Friday and is having a reaction. [name] c/o whole body itching and rash on his arms, back, legs, top of feet x Saturday. [name] has been taking benadryl 25 mg 1 tablet every 4 hrs. [name] is requesting to have something called in to help bc he is itching all over. Please advise Dr. Kriste Basque, thanks  No Known Allergies   [facility]

## 2011-07-16 NOTE — Progress Notes (Signed)
Patient called to report itching that started Saturday on the insides of his arms and has progressed to an itchy rash "all over."  He had a LEPI on Friday, 07/13/11.  Started taking Benadryl 50mg  PO this morning and states he will continue this every six hours as needed.  Understands to call primary MD is condition does not improve and to call 911 if he develops shortness of breath.  jkl

## 2011-07-16 NOTE — Telephone Encounter (Signed)
Per SN---call in pred dosepak  5mg    6 day pack, take benadryl 25mg    Every 4 hours prn , add pepcid otc 20mg   Bid  And atarax 25mg    #50  1 every 4 hours prn for itching.  Called and spoke with pt and he is aware of SN recs.  Will call for any other concerns.

## 2011-07-18 ENCOUNTER — Encounter (INDEPENDENT_AMBULATORY_CARE_PROVIDER_SITE_OTHER): Payer: Self-pay | Admitting: Surgery

## 2011-07-18 ENCOUNTER — Ambulatory Visit (INDEPENDENT_AMBULATORY_CARE_PROVIDER_SITE_OTHER): Payer: MEDICARE | Admitting: Surgery

## 2011-07-18 VITALS — BP 136/88 | HR 46 | Temp 97.2°F | Resp 16 | Ht 69.0 in | Wt 248.0 lb

## 2011-07-18 DIAGNOSIS — L723 Sebaceous cyst: Secondary | ICD-10-CM

## 2011-07-18 NOTE — Progress Notes (Signed)
[facility]  Re:   [name] DOB:   10-Dec-1945 MRN:   [id number]  ASSESSMENT AND PLAN: 1.  Cyst on back.  Inflamed.  The area is healed.  I don't feel a residual cyst.  Will observe.  If he feels a cyst growing, he will back in touch with [place].  I gave him a paper on sebaceous cyst.  Return to office PRN.  2.  OSA, on CPAP. 3.  Hypertension. 4.  CAD.  Sees Dr. Algis Downs. [place]. 5.  Hyperlipidemia. 6.  Morbid Obesity, BMI 37.8. 7.  On plavix. 8.  He needed an injection in his back for spasm about one month ago.  REFERRING PHYSICIAN: Michele Mcalpine, MD, MD  HISTORY OF PRESENT ILLNESS: [name] is a 66 y.o. (DOB: 05-07-1946)  white male whose primary care physician is NADEL,SCOTT M, MD, MD and comes for f/u of an infected cyst on his midback.  The wound has healed.  He has had some back issues which required an injection.  Now he says that his wife is having the same problem.    Past Medical History  Diagnosis Date  . CAD (coronary artery disease)     s/p CABG  . Allergic rhinitis   . OSA (obstructive sleep apnea)   . HTN (hypertension)   . Hyperlipidemia   . Obesity   . GERD (gastroesophageal reflux disease)   . Diverticulosis of colon   . Personal history of colonic polyps 07/2010    tublar adenoma  . Renal insufficiency   . Organic impotence   . DJD (degenerative joint disease)   . Lumbar back pain   . Hyperuricemia   . Anxiety   . Hemorrhoids   . Chronic gastritis   . Herpes   . Esophageal stricture   . Pinched nerve     back      Current Outpatient Prescriptions  Medication Sig Dispense Refill  . acyclovir (ZOVIRAX) 200 MG capsule Take 1 capsule (200 mg total) by mouth 3 (three) times daily.  270 capsule  3  . amLODipine (NORVASC) 10 MG tablet Take 1 tablet (10 mg total) by mouth daily.  90 tablet  3  . aspirin 81 MG tablet Take 81 mg by mouth daily.        . cloNIDine (CATAPRES) 0.1 MG tablet Take 1 tablet (0.1 mg total) by mouth 3 (three) times  daily.  270 tablet  3  . clopidogrel (PLAVIX) 75 MG tablet Take 1 tablet (75 mg total) by mouth daily.  90 tablet  3  . famotidine (PEPCID AC) 10 MG chewable tablet Chew 10 mg by mouth 2 (two) times daily.        . fexofenadine (ALLEGRA) 180 MG tablet Take 1 tablet (180 mg total) by mouth daily.  90 tablet  3  . fish oil-omega-3 fatty acids 1000 MG capsule Take 2 g by mouth daily.        . furosemide (LASIX) 20 MG tablet Take 1 tablet (20 mg total) by mouth daily.  90 tablet  3  . HYDROcodone-acetaminophen (VICODIN) 5-500 MG per tablet Take 1 tablet by mouth every 6 (six) hours as needed.  100 tablet  1  . hydrOXYzine (ATARAX/VISTARIL) 10 MG tablet Take 1 tablet by mouth every 4 hours as needed for itching  50 tablet  1  . hyoscyamine (LEVSIN SL) 0.125 MG SL tablet Place 1 tablet (0.125 mg total) under the tongue every 4 (four) hours as needed.  90 tablet  3  . labetalol (NORMODYNE) 200 MG tablet Take 1 tablet (200 mg total) by mouth 3 (three) times daily.  270 tablet  3  . lisinopril (PRINIVIL,ZESTRIL) 40 MG tablet Take 1 tablet (40 mg total) by mouth daily.  90 tablet  3  . lovastatin (MEVACOR) 40 MG tablet Take 2 tablets (80 mg total) by mouth at bedtime.  180 tablet  3  . nitroGLYCERIN (NITROSTAT) 0.4 MG SL tablet Place 0.4 mg under the tongue every 5 (five) minutes as needed.        . pantoprazole (PROTONIX) 40 MG tablet Take 1 tablet (40 mg total) by mouth daily.  90 tablet  3  . predniSONE (STERAPRED UNI-PAK) 5 MG TABS Take as directed.  Please give a 6 day pack  1 each  0     No Known Allergies  REVIEW OF SYSTEMS: Skin:  No history of rash.  No history of abnormal moles. Cardiac:  Hypertension. CAD.  Sees Dr. Eula Fried for cardiology. Pulmonary:  OSA, on CPAP.  Endocrine:  No diabetes. No thyroid disease. Gastrointestinal:  Lap chole by Dr. Haroldine Laws 03/21/2006.  Diverticulosis.  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  PHYSICAL EXAM: BP 136/88   Pulse 46  Temp(Src) 97.2 F (36.2 C) (Temporal)  Resp 16  Ht 5\' 9"  (1.753 m)  Wt 248 lb (112.492 kg)  BMI 36.62 kg/m2  General: WN obese WM, who is alert and generally healthy appearing.   Back: 1.5 cm scar in midback with no evidence of infection or cyst.  Ovidio Kin, MD,  [facility] [address] [place]  [address]   [place], [place]    [address] Phone:  [phone] FAX:  [phone]

## 2011-08-14 ENCOUNTER — Ambulatory Visit: Payer: Medicare Other | Admitting: Pulmonary Disease

## 2011-09-03 ENCOUNTER — Telehealth: Payer: Self-pay | Admitting: Pulmonary Disease

## 2011-09-03 NOTE — Telephone Encounter (Signed)
Called and spoke with pt and per SN---does not sound like we need to do xrays at this time---per SN---rest, apply heat, no yard work, leaf blowing.  Pt voiced his understanding and will call back for any other concerns.

## 2011-09-03 NOTE — Telephone Encounter (Signed)
Called and spoke with pt. Pt states he was blowing chips from a tree that was cut down yesterday and tripped over cinder blocks and fell backwards.  Pt states he denies any pain but does state there is "soreness" in his lower back first thing in the AM and when he gets up out of a chair.  States wife informed him there is bruising across his back.  Pt denies any pain or tingling down legs.  Pt states he has some vicodin at the home that he is using to help with the soreness, which does help.  Pt just wanted to make sure he doesn't need an xray or to be seen.  Sn, please advise.  Thanks! No Known Allergies

## 2011-09-07 ENCOUNTER — Ambulatory Visit: Payer: Self-pay | Admitting: Pulmonary Disease

## 2011-09-10 ENCOUNTER — Telehealth: Payer: Self-pay | Admitting: Pulmonary Disease

## 2011-09-10 NOTE — Telephone Encounter (Signed)
Pt called and stated he had to cancel his apt for 10/09/11 bc he is going on vacation but is still wanting to have his labs drawn though. Please advise Dr. Kriste Basque, thanks

## 2011-09-10 NOTE — Telephone Encounter (Signed)
Duplicate message. 

## 2011-09-10 NOTE — Telephone Encounter (Signed)
Pt has OV 10/09/11 and is requesting to have labs done prior. Please advise what labs pt will need to have done Dr. Kriste Basque, thanks

## 2011-09-11 ENCOUNTER — Ambulatory Visit: Payer: Self-pay | Admitting: Pulmonary Disease

## 2011-09-11 ENCOUNTER — Other Ambulatory Visit: Payer: Self-pay | Admitting: Pulmonary Disease

## 2011-09-11 DIAGNOSIS — Z0000 Encounter for general adult medical examination without abnormal findings: Secondary | ICD-10-CM

## 2011-09-11 NOTE — Telephone Encounter (Signed)
LMOM for pt to be aware he can go ahead and come for fasting labs.

## 2011-09-11 NOTE — Telephone Encounter (Signed)
Labs are in the computer for the pt thanks 

## 2011-09-13 ENCOUNTER — Other Ambulatory Visit (INDEPENDENT_AMBULATORY_CARE_PROVIDER_SITE_OTHER): Payer: Self-pay

## 2011-09-13 DIAGNOSIS — E785 Hyperlipidemia, unspecified: Secondary | ICD-10-CM

## 2011-09-13 DIAGNOSIS — Z0000 Encounter for general adult medical examination without abnormal findings: Secondary | ICD-10-CM

## 2011-09-13 LAB — CBC WITH DIFFERENTIAL/PLATELET
Basophils Absolute: 0.1 10*3/uL (ref 0.0–0.1)
Eosinophils Absolute: 0.4 10*3/uL (ref 0.0–0.7)
HCT: 42 % (ref 39.0–52.0)
Lymphs Abs: 1.7 10*3/uL (ref 0.7–4.0)
Monocytes Absolute: 1 10*3/uL (ref 0.1–1.0)
Monocytes Relative: 10.5 % (ref 3.0–12.0)
Platelets: 247 10*3/uL (ref 150.0–400.0)
RDW: 16.5 % — ABNORMAL HIGH (ref 11.5–14.6)

## 2011-09-13 LAB — BASIC METABOLIC PANEL
CO2: 29 mEq/L (ref 19–32)
GFR: 69.6 mL/min (ref >60.00)
Glucose, Bld: 92 mg/dL (ref 70–99)
Potassium: 4.6 mEq/L (ref 3.5–5.1)
Sodium: 142 mEq/L (ref 135–145)

## 2011-09-13 LAB — LIPID PANEL
HDL: 32.9 mg/dL — ABNORMAL LOW (ref >39.00)
LDL Cholesterol: 65 mg/dL (ref 0–99)
Total CHOL/HDL Ratio: 4

## 2011-09-13 LAB — HEPATIC FUNCTION PANEL
AST: 17 U/L (ref 0–37)
Albumin: 4.1 g/dL (ref 3.5–5.2)
Alkaline Phosphatase: 79 U/L (ref 39–117)

## 2011-09-13 LAB — TSH: TSH: 2.29 u[IU]/mL (ref 0.35–5.50)

## 2011-09-17 ENCOUNTER — Telehealth: Payer: Self-pay | Admitting: Pulmonary Disease

## 2011-09-17 ENCOUNTER — Other Ambulatory Visit: Payer: Self-pay | Admitting: *Deleted

## 2011-09-17 MED ORDER — ALLOPURINOL 300 MG PO TABS: 300.0000 mg | ORAL_TABLET | Freq: Every day | ORAL | Status: DC

## 2011-09-17 MED ORDER — DOXYCYCLINE HYCLATE 100 MG PO TABS: 100.0000 mg | ORAL_TABLET | Freq: Two times a day (BID) | ORAL | Status: AC

## 2011-09-17 NOTE — Telephone Encounter (Signed)
I spoke with pt and advised him according to his lab results it's due to his psa being elevated. Pt voiced his understanding and had no further questions

## 2011-09-17 NOTE — Telephone Encounter (Signed)
I spoke with pt and stated he needed an 90 day supply of his allopurinol sent to [facility] and a 30 day supply sent to [facility] since he will run out before he can get the shipment in. I have printed off rx to have SN sign so we can fax to them once done

## 2011-09-17 NOTE — Telephone Encounter (Signed)
rx has been faxed to [facility]

## 2011-10-09 ENCOUNTER — Ambulatory Visit: Payer: Self-pay | Admitting: Pulmonary Disease

## 2011-10-22 ENCOUNTER — Other Ambulatory Visit (INDEPENDENT_AMBULATORY_CARE_PROVIDER_SITE_OTHER): Payer: MEDICARE

## 2011-10-22 ENCOUNTER — Encounter: Payer: Self-pay | Admitting: Pulmonary Disease

## 2011-10-22 ENCOUNTER — Ambulatory Visit (INDEPENDENT_AMBULATORY_CARE_PROVIDER_SITE_OTHER): Payer: MEDICARE | Admitting: Pulmonary Disease

## 2011-10-22 VITALS — BP 120/68 | HR 54 | Temp 96.9°F | Ht 69.0 in | Wt 262.6 lb

## 2011-10-22 DIAGNOSIS — K5730 Diverticulosis of large intestine without perforation or abscess without bleeding: Secondary | ICD-10-CM

## 2011-10-22 DIAGNOSIS — D126 Benign neoplasm of colon, unspecified: Secondary | ICD-10-CM

## 2011-10-22 DIAGNOSIS — M545 Low back pain: Secondary | ICD-10-CM

## 2011-10-22 DIAGNOSIS — E669 Obesity, unspecified: Secondary | ICD-10-CM

## 2011-10-22 DIAGNOSIS — M1990 Unspecified osteoarthritis, unspecified site: Secondary | ICD-10-CM

## 2011-10-22 DIAGNOSIS — I10 Essential (primary) hypertension: Secondary | ICD-10-CM

## 2011-10-22 DIAGNOSIS — G4733 Obstructive sleep apnea (adult) (pediatric): Secondary | ICD-10-CM

## 2011-10-22 DIAGNOSIS — F411 Generalized anxiety disorder: Secondary | ICD-10-CM

## 2011-10-22 DIAGNOSIS — I2510 Atherosclerotic heart disease of native coronary artery without angina pectoris: Secondary | ICD-10-CM

## 2011-10-22 DIAGNOSIS — E785 Hyperlipidemia, unspecified: Secondary | ICD-10-CM

## 2011-10-22 DIAGNOSIS — K219 Gastro-esophageal reflux disease without esophagitis: Secondary | ICD-10-CM

## 2011-10-22 DIAGNOSIS — R972 Elevated prostate specific antigen [PSA]: Secondary | ICD-10-CM

## 2011-10-22 MED ORDER — SILDENAFIL CITRATE 100 MG PO TABS: ORAL_TABLET | ORAL | Status: DC

## 2011-10-22 NOTE — Patient Instructions (Signed)
Today we updated your med list in our EPIC system...    Continue your current medications the same...  We reviewed your recent fasting blood work today, and we rechecked your PSA...    We will call you w/ the result when avail...  Let's get on track w/ our diet & exercise program...    The goal is to lose 15-20 lbs...  Call for any questions...  Let's plan a follow up visit in 6 months.Marland KitchenMarland Kitchen

## 2011-10-22 NOTE — Progress Notes (Signed)
Subjective:    Patient ID: [name], male    DOB: 1946-02-18, 66 y.o.   MRN: [id number]  HPI 66 y/o WM here for a follow up visit... he has multiple medical problems including OSA on CPAP;  HBP controlled on 5 drug regimen;  CAD- s/p CABGx4 in 2002 & followed by DrBensimhon;  Hyperlipidemia on Lovastatin;  Obesity & just can't seen to lose the weight;  GERD/ Divertics/ Polyps followed by DrPerry for GI;  DJD & hx hyperuricemia;  Anxiety...  ~  August 14, 2010:  13mo ROV- CC= left shoulder & arm pain x2-3 months & he takes Vicodin w/ relief but hasn't seen Ortho for eval & he will call DrBeane when he is ready;  note- hx hyperuricemia, on Allopurinol 300mg /d & Uric= 5.1 today...    He saw DrPerry 1/12 w/ reflux symptoms, dysphagia, abd pain & rectal bleeding> EGD & Colonoscopy 2/12 showed esoph stricture- dilated, and divertics, hems, & one adenomatous polyp removed (f/u planned 76yrs);  he remains on Protonix daily & Levsin as needed along w/ laxatives Prn.    He saw DrBensimhon 1/12 for Cardiac eval prior to his procedures> felt to be stable, BP controlled, & doing satis overall- OK to hold Plavix for the procedures;  continued on his med regimen w/ Labetolol, Catapress, Amlodipine, Lisinopril, HCTZ, ASA/ Plavix.    He remains on Lovastatin80 for his Chol> FLP today looks good x sl incr TG 153 & low HDL 31;  discussed low fat diet & exercise program (difficult for him due to arthritis).    Otherw stable> we reviewed meds & he requests 90 refills;  Fasting labs reviewed;  last CXR 3/11 showed NAD- s/p CABG, scarring right mid lung zone, spondylosis Tspine...  ~  February 13, 2011:  13mo ROV & c/o allergies, hems, arthritis/ gout > BP controlled on regimen (see below) including HCT but he wants stronger diuretic & we will switch to Lasix20;  Known CAD stable on meds + ASA/Plavix w/o angina; remains on Lovastatin for Chol w/ labs below;  Has not been able to lose weight despite diet counseling etc>  he really need to try harder...    He had an EGD 2/12 by DrPerry (sl stricture- dilated, otherw WNL), & Colonoscopy 2/12 w/ diminutive polyp, severe divertics, & int hems> path= tubular adenoma...    He saw GI/ Amy 6/12 for worsening hems w/ bleeding (on ASA/ Plavix)> Rx Balmex & Analpram 2.5% creams Tid, & f/u w/ DrPerry to see if injection vs surg is needed; Hg= 14.6     He saw DrBensimhon 5/12 for f/u HBP, CAD s/p CABG 2002 & stenting of LAD 2006 & Circ 2003 w/ drug eluting stents> doing well, no angina, exercising some, no changes made; EGK w/ SBrady, NAD...  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  ~  Oct 22, 2011:  13mo ROV & [name] is c/o some diffuse arthritic aches and  pains for which he uses OTC analgesics vs Vicodin;  He had labs done 3/13 (see below)& his PSA was elev at 4.98 representing a marked incr from 1.30 a yr ago> he was treated w/ Doxy100mg Bid x2wks & PSA repeated today= 4.17 so he will need referral to Urology for further eval & poss biopsy...  Kalob also had an infected sebaceous cyst on his back I&D'd by [facility] & it has resolved... Finally he has had increased LBP w/ MRI from DrCaffrey showing spondylosis worst at L4-5 w/ extruded disc frag & mod central canal stenosis (see report);  He had ESI 1/13 by DrMattern w/ some improvement; he is sched for f/u w/ DrCaffrey/ DrTooke...  We reviewed his prob list, meds, XRays, & Labs>> LABS 3/13:  FLP-ok on Lova80 x HDL=33;  Chems- ok;  CBC- ok;   TSH=2.29;  PSA=4.98... LABS 5/13:  After Doxy100mg Bid x14d showed PSA=4.17 (pt referred to Urology...   PROBLEM LIST:    << PROBLEM LIST UPDATED 10/22/11 >>  ALLERGIC RHINITIS (ICD-477.9) - advised not to use Afrin etc... rather take OTC antihist (Claritin, Zyrtek, etc), Saline, FLONASE Qhs...  OBSTRUCTIVE SLEEP APNEA (ICD-327.23) - on CPAP regularly (can't locate sleep study results), doing well by his report.  HYPERTENSION (ICD-401.9) - well controlled on a 5 drug regimen: LABETALOL 200mg Tid,  CATAPRES 0.1mg Tid,  AMLODIPINE 10mg /d,  LISINOPRIL 40mg /d,  HCTZ 25mg /d... takes meds regularly and tol well...  ~  CXR 3/11 is clear, WNL (heart upper limit of norm). ~  8/12:  He wants stronger diuretic & we will change HCT to LASIX 20mg /d, reminded regarding low sodium! ~  10/12:  BP=118/72 & denies HA, fatigue, visual changes, CP, palipit, dizziness, syncope, dyspnea, edema, etc> needs better diet, continue same meds. ~  5/13:  BP= 120/68 & he remains asymptomatic; continue the good work...  CORONARY ARTERY DISEASE (ICD-414.00) - on above meds + ASA 81mg /d & PLAVIX 75mg /d... S/P CABG x4 9/02 by DrOwen... subseq PTCA/stent to native CIRC 6/03 (& occluded vein graft to LAD noted); last cath= 2/06 w/ PTCA/stent in LAD distal to LIMA touchdown, otherw no change... NuclearStressTest 9/07 showed neg w/ EF=62%... ~  3/10:  yearly ROV DrBensimhon doing well, no changes made... ~  3/11:  stable- StressTest was neg- no signif ST seg depression; ArtDopplers LEs showed norm ABIs. ~  He continues to f/u w/ DrBensimhon yearly...  HYPERLIPIDEMIA (ICD-272.4) - back on LOVASTATIN 80mg /d (40mg - 2tabsQhs) due to $$$... + FISH OIL 3/d... ~  FLP 7/08 on Lovastatin showed TChol 157, TG 127, HDL 30, LDL 101... ch to LIP40 per Cards... ~  FLP 1/09 on Lip40 showed TChol 125, Tg 105, HDL 31, LDL 73... ch back to Lovastin80 due to $$$... ~  FLP 7/09 on Lova80 showed TChol 142, TG 111, HDL 31, LDL 89... rec- continue same, get  wt down. ~  FLP 1/10 showed TChol 130, TG 99, HDL 30, LDL 80... rec> get wt down or stronger statin Rx! ~  FLP 3/11 showed TChol 138, TG 96, HDL 43, LDL 76... continue same. ~  FLP 8/11 showed TChol 140, TG 145, HDL 33, LDL 78 ~  FLP 8/12 on Lova80 showed TChol 146, TG 114, HDL 42, LDL 81 ~  FLP 3/13 on Lova80 showed TChol 118, TG 100, HDL 33, LDL 65  OBESITY (ICD-278.00) ~  7/09:  he's done a better job w/ diet + exercise and weight down 13# in 34mo to 242#. ~  1/10:  unfortunately weight back up 11#  over the holidays to 253#... rec- diet/ exercise discussed. ~  10/10: weight = 260# but pt notes he & wife to start diet soon!!! ~  3/11:  weight = 260# ~  8/11:  weight = 251#.Marland Kitchen. states "my wife keeps on bringing it home" ~  8/12:  Weight = 257#... We reviewed diet & exercise prescriptions! ~  5/13:  Weight = 263#  GERD (ICD-530.81) - last EGD by DrSam 3/06 revealed severe antral gastitis.Marland Kitchen. now on PROTONIX 40mg /d, and PEPCID 20mg Qhs... ~  EGD 2/12 by DrPerry (sl stricture- dilated, otherw WNL)...  DIVERTICULOSIS OF COLON (ICD-562.10) & COLONIC POLYPS (ICD-211.3)  ~  Colonoscopy 7/05 by DrSam showed divertics, hems, otherw neg... f/u planned 85yrs. ~  Colonoscopy 2/12 w/ diminutive polyp, severe divertics, & int hems> path= tubular adenoma...  RENAL INSUFFICIENCY (ICD-588.9) - he knows to avoid NSAIDs and nephrotoxic drugs... 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 ~  Baseline PSA for yrs was betw 1.08 & 1.83 ~  Labs 3/13 showed PSA= 4.98 & he was treated w/ Doxy100mg  Bid x14d... ~  Labs 5/13 showed PSA= 4.17 & he was referred to Urology for further eval... ~  5/13:  Pt requested Rx for  Viagra 100mg  & we reviewed precautions & contraindications...  DEGENERATIVE JOINT DISEASE (ICD-715.90) & HYPERURICEMIA (ICD-790.6) - as per eval DrBeane w/ ?gout- Rx'd w/ left knee shot, Indocin... back on ALLOPURINOL 300mg /d now... ~  labs 3/09 showed Uric= 8.7... ~  labs 7/09 showed Uric= 5.6.Marland KitchenMarland Kitchen rec- continue Allopurinol. ~  9/10: dropped timber on left foot w/ hematoma, cellulitis- Rx'd... ~  labs 9/10 showed Uric= 5.9 on Uloric40. ~  labs 3/11 showed URIC= 6.3 back on Allopurinol 300mg /d. ~  Labs 8/12 showed Uric = 6.2  BACK PAIN, LUMBAR (ICD-724.2)  ANXIETY (ICD-300.00)   Past Surgical History  Procedure Date  . Coronary artery bypass graft   . Angioplasty   . Cholecystectomy 2006    [place]    Outpatient Encounter Prescriptions as of 10/22/2011  Medication Sig Dispense Refill  . acyclovir (ZOVIRAX) 200 MG capsule Take 1 capsule (200 mg total) by mouth 3 (three) times daily.  270 capsule  3  . allopurinol (ZYLOPRIM) 300 MG tablet Take 1 tablet (300 mg total) by mouth daily.  90 tablet  3  . amLODipine (NORVASC) 10 MG tablet Take 1 tablet (10 mg total) by mouth daily.  90 tablet  3  . aspirin 81 MG tablet Take 81 mg by mouth daily.        . cloNIDine (CATAPRES) 0.1 MG tablet Take 1 tablet (0.1 mg total) by mouth 3 (three) times daily.  270 tablet  3  . clopidogrel (PLAVIX) 75 MG tablet Take 1 tablet (75 mg total) by mouth daily.  90 tablet  3  . famotidine (PEPCID AC) 10 MG chewable tablet Chew 10 mg by mouth 2 (two) times daily.        . fexofenadine (ALLEGRA) 180 MG tablet Take 1 tablet (180 mg total) by mouth daily.  90 tablet  3  . fish oil-omega-3 fatty acids 1000 MG capsule Take 2 g by mouth daily.        Marland Kitchen  furosemide (LASIX) 20 MG tablet Take 1 tablet (20 mg total) by mouth daily.  90 tablet  3  . HYDROcodone-acetaminophen (VICODIN) 5-500 MG per tablet Take 1 tablet by mouth every 6 (six) hours as needed.  100 tablet  1  . hydrOXYzine (ATARAX/VISTARIL) 10 MG tablet Take  1 tablet by mouth every 4 hours as needed for itching  50 tablet  1  . hyoscyamine (LEVSIN SL) 0.125 MG SL tablet Place 1 tablet (0.125 mg total) under the tongue every 4 (four) hours as needed.  90 tablet  3  . labetalol (NORMODYNE) 200 MG tablet Take 1 tablet (200 mg total) by mouth 3 (three) times daily.  270 tablet  3  . lisinopril (PRINIVIL,ZESTRIL) 40 MG tablet Take 1 tablet (40 mg total) by mouth daily.  90 tablet  3  . lovastatin (MEVACOR) 40 MG tablet Take 2 tablets (80 mg total) by mouth at bedtime.  180 tablet  3  . nitroGLYCERIN (NITROSTAT) 0.4 MG SL tablet Place 0.4 mg under the tongue every 5 (five) minutes as needed.        . pantoprazole (PROTONIX) 40 MG tablet Take 1 tablet (40 mg total) by mouth daily.  90 tablet  3  . predniSONE (STERAPRED UNI-PAK) 5 MG TABS Take as directed.  Please give a 6 day pack  1 each  0    No Known Allergies   Current Medications, Allergies, Past Medical History, Past Surgical History, Family History, and Social History were reviewed in [employer] record.     Review of Systems         See HPI - all other systems neg except as noted...  The patient complains of dyspnea on exertion and difficulty walking.  The patient denies anorexia, fever, weight loss, weight gain, vision loss, decreased hearing, hoarseness, chest pain, syncope, peripheral edema, prolonged cough, headaches, hemoptysis, abdominal pain, melena, hematochezia, severe indigestion/heartburn, hematuria, incontinence, muscle weakness, suspicious skin lesions, transient blindness, depression, unusual weight change, abnormal bleeding, enlarged lymph nodes, and angioedema.   Objective:   Physical Exam    WD, Obese, 66 y/o WM in NAD... GENERAL:  Alert & oriented; pleasant & cooperative... HEENT:  [place]/AT, EOM-wnl, PERRLA, EACs-clear, TMs-wnl, NOSE-clear, THROAT-clear & wnl. NECK:  Supple w/ fairROM; no JVD; normal carotid impulses w/o bruits; no thyromegaly or nodules  palpated; no lymphadenopathy. CHEST:  Median sternotomy scar, clear to P & A; without wheezes/ rales/ or rhonchi. HEART:  Regular Rhythm; without murmurs/ rubs/ or gallops heard. ABDOMEN:  Obese, soft & nontender; normal bowel sounds; no organomegaly or masses detected. EXT: without deformities, mild arthritic changes; no varicose veins/ +venous insuffic, tr edema... NEURO:  CN's intact; motor testing normal; sensory testing normal; gait normal & balance OK. DERM:  No lesions noted; no rash etc...   Assessment & Plan:   AR>  rec to use an Antihist Qam, Saline nasal mist during the days, and Flonase Qhs...  OSA>  He reports stable on CPAP w/o daytime hypersomnolence etc...  HBP>  Controlled on his regimen, & tolerating the Lasix well...  CAD, s/p CABG & subseq PCI/ stents>  Stable on BP meds + ASA/Plavix; followed by DrBensimhon...  HYPERLIPID>  FLP remains close to goals on diet + Lovastatin80, unfortunately he hasn't lost any weight...  OBESITY>  We reviewed diet + exercise prescription...  GI> GERD, Divertics, Polyps>  recent EGD & colon reviewed w/ the pt...  GU> elevated PSA w/ min improvement after antibiotic Rx; refer  to Urology for further eval...  DJD/ GOUT/ LBP>  Prev URIC level = 6.2 on the Allopurinol daily;  He had LBP eval by DrTooke & Caffrey w/ one ESI so far...  Anxiety>  He does not want anxiolytic Rx...   Patient's Medications  New Prescriptions   SILDENAFIL (VIAGRA) 100 MG TABLET    Take as directed  Previous Medications   ACYCLOVIR (ZOVIRAX) 200 MG CAPSULE    Take 1 capsule (200 mg total) by mouth 3 (three) times daily.   ALLOPURINOL (ZYLOPRIM) 300 MG TABLET    Take 1 tablet (300 mg total) by mouth daily.   AMLODIPINE (NORVASC) 10 MG TABLET    Take 1 tablet (10 mg total) by mouth daily.   ASPIRIN 81 MG TABLET    Take 81 mg by mouth daily.     CLONIDINE (CATAPRES) 0.1 MG TABLET    Take 1 tablet (0.1 mg total) by mouth 3 (three) times daily.    CLOPIDOGREL (PLAVIX) 75 MG TABLET    Take 1 tablet (75 mg total) by mouth daily.   FAMOTIDINE (PEPCID AC) 10 MG CHEWABLE TABLET    Chew 10 mg by mouth 2 (two) times daily.     FEXOFENADINE (ALLEGRA) 180 MG TABLET    Take 1 tablet (180 mg total) by mouth daily.   FISH OIL-OMEGA-3 FATTY ACIDS 1000 MG CAPSULE    Take 2 g by mouth daily.     FUROSEMIDE (LASIX) 20 MG TABLET    Take 1 tablet (20 mg total) by mouth daily.   HYDROCODONE-ACETAMINOPHEN (VICODIN) 5-500 MG PER TABLET    Take 1 tablet by mouth every 6 (six) hours as needed.   HYOSCYAMINE (LEVSIN SL) 0.125 MG SL TABLET    Place 1 tablet (0.125 mg total) under the tongue every 4 (four) hours as needed.   LABETALOL (NORMODYNE) 200 MG TABLET    Take 1 tablet (200 mg total) by mouth 3 (three) times daily.   LISINOPRIL (PRINIVIL,ZESTRIL) 40 MG TABLET    Take 1 tablet (40 mg total) by mouth daily.   LOVASTATIN (MEVACOR) 40 MG TABLET    Take 2 tablets (80 mg total) by mouth at bedtime.   NITROGLYCERIN (NITROSTAT) 0.4 MG SL TABLET    Place 0.4 mg under the tongue every 5 (five) minutes as needed.     PANTOPRAZOLE (PROTONIX) 40 MG TABLET    Take 1 tablet (40 mg total) by mouth daily.  Modified Medications   No medications on file  Discontinued Medications   HYDROXYZINE (ATARAX/VISTARIL) 10 MG TABLET    Take 1 tablet by mouth every 4 hours as needed for itching   PREDNISONE (STERAPRED UNI-PAK) 5 MG TABS    Take as directed.  Please give a 6 day pack

## 2011-10-23 ENCOUNTER — Other Ambulatory Visit: Payer: Self-pay | Admitting: Pulmonary Disease

## 2011-10-23 ENCOUNTER — Telehealth: Payer: Self-pay | Admitting: Pulmonary Disease

## 2011-10-23 DIAGNOSIS — R972 Elevated prostate specific antigen [PSA]: Secondary | ICD-10-CM

## 2011-10-23 NOTE — Telephone Encounter (Signed)
[email or web address]  

## 2011-10-24 NOTE — Telephone Encounter (Signed)
Called, spoke with pt who states he has already been scheduled to see Dr. Laverle Patter.  Pt states he will check his list of providers to see if Dr. Laverle Patter is included in his insurance plan.  He will call back if anything further is needed.

## 2011-11-27 ENCOUNTER — Ambulatory Visit (INDEPENDENT_AMBULATORY_CARE_PROVIDER_SITE_OTHER): Payer: MEDICARE | Admitting: Internal Medicine

## 2011-11-27 ENCOUNTER — Encounter: Payer: Self-pay | Admitting: Internal Medicine

## 2011-11-27 VITALS — BP 130/68 | HR 56 | Ht 69.0 in | Wt 256.0 lb

## 2011-11-27 DIAGNOSIS — I10 Essential (primary) hypertension: Secondary | ICD-10-CM

## 2011-11-27 DIAGNOSIS — R0602 Shortness of breath: Secondary | ICD-10-CM

## 2011-11-27 DIAGNOSIS — I2510 Atherosclerotic heart disease of native coronary artery without angina pectoris: Secondary | ICD-10-CM

## 2011-11-27 DIAGNOSIS — R609 Edema, unspecified: Secondary | ICD-10-CM | POA: Insufficient documentation

## 2011-11-27 DIAGNOSIS — E785 Hyperlipidemia, unspecified: Secondary | ICD-10-CM

## 2011-11-27 LAB — BRAIN NATRIURETIC PEPTIDE: Pro B Natriuretic peptide (BNP): 184 pg/mL — ABNORMAL HIGH (ref 0.0–100.0)

## 2011-11-27 MED ORDER — FUROSEMIDE 20 MG PO TABS: ORAL_TABLET | ORAL | Status: DC

## 2011-11-27 NOTE — Assessment & Plan Note (Signed)
LDL at goal. Continue current therapy.  

## 2011-11-27 NOTE — Assessment & Plan Note (Signed)
No evidence of ischemia. Continue current regimen.   

## 2011-11-27 NOTE — Progress Notes (Signed)
Addended by: Freddi Starr on: 11/27/2011 09:11 AM   Modules accepted: Orders

## 2011-11-27 NOTE — Progress Notes (Signed)
Patient ID: [name], male   DOB: 04-Oct-1945, 66 y.o.   MRN: [id number] HPI:  [name] is a very pleasant 66 year old male with a history of HTN, OSA, HL and CAD s/p CABG 2002 and stenting of LAD (2006) and LCX (2003) with drug-eluting stents  Had ETT 4/11 which was normal.   Main issue lately is severe back pain due to a herniated disk. Receiving injections but not helping that much currently.   Still trying to walk on treadmill but limited due to back pain.  Denies CP. + mild stable DOE. Having problems pedal edema - started on diuretic by Dr. Kriste Basque. Compliant with all medications. BP well controlled. No orthostasis. Compliant with CPAP.   Lipids followed by Dr. Kriste Basque.   Lab Results  Component Value Date   CHOL 118 09/13/2011   HDL 32.90* 09/13/2011   LDLCALC 65 09/13/2011   TRIG 100.0 09/13/2011   CHOLHDL 4 09/13/2011      ROS: All systems negative except as listed in HPI, PMH and Problem List.  Past Medical History  Diagnosis Date  . CAD (coronary artery disease)     s/p CABG  . Allergic rhinitis   . OSA (obstructive sleep apnea)   . HTN (hypertension)   . Hyperlipidemia   . Obesity   . GERD (gastroesophageal reflux disease)   . Diverticulosis of colon   . Personal history of colonic polyps 07/2010    tublar adenoma  . Renal insufficiency   . Organic impotence   . DJD (degenerative joint disease)   . Lumbar back pain   . Hyperuricemia   . Anxiety   . Hemorrhoids   . Chronic gastritis   . Herpes   . Esophageal stricture   . Pinched nerve     back    Current Outpatient Prescriptions  Medication Sig Dispense Refill  . acyclovir (ZOVIRAX) 200 MG capsule Take 1 capsule (200 mg total) by mouth 3 (three) times daily.  270 capsule  3  . allopurinol (ZYLOPRIM) 300 MG tablet Take 1 tablet (300 mg total) by mouth daily.  90 tablet  3  . amLODipine (NORVASC) 10 MG tablet Take 1 tablet (10 mg total) by mouth daily.  90 tablet  3  . aspirin 81 MG tablet Take 81 mg by mouth  daily.        . cloNIDine (CATAPRES) 0.1 MG tablet Take 1 tablet (0.1 mg total) by mouth 3 (three) times daily.  270 tablet  3  . clopidogrel (PLAVIX) 75 MG tablet Take 1 tablet (75 mg total) by mouth daily.  90 tablet  3  . famotidine (PEPCID AC) 10 MG chewable tablet Chew 10 mg by mouth 2 (two) times daily.        . fexofenadine (ALLEGRA) 180 MG tablet Take 1 tablet (180 mg total) by mouth daily.  90 tablet  3  . fish oil-omega-3 fatty acids 1000 MG capsule Take 2 g by mouth daily.        . furosemide (LASIX) 20 MG tablet Take 1 tablet (20 mg total) by mouth daily.  90 tablet  3  . HYDROcodone-acetaminophen (VICODIN) 5-500 MG per tablet Take 1 tablet by mouth every 6 (six) hours as needed.  100 tablet  1  . hyoscyamine (LEVSIN SL) 0.125 MG SL tablet Place 1 tablet (0.125 mg total) under the tongue every 4 (four) hours as needed.  90 tablet  3  . labetalol (NORMODYNE) 200 MG tablet Take 1 tablet (200 mg  total) by mouth 3 (three) times daily.  270 tablet  3  . lisinopril (PRINIVIL,ZESTRIL) 40 MG tablet Take 1 tablet (40 mg total) by mouth daily.  90 tablet  3  . lovastatin (MEVACOR) 40 MG tablet Take 2 tablets (80 mg total) by mouth at bedtime.  180 tablet  3  . pantoprazole (PROTONIX) 40 MG tablet Take 1 tablet (40 mg total) by mouth daily.  90 tablet  3  . sildenafil (VIAGRA) 100 MG tablet Take as directed  10 tablet  5     PHYSICAL EXAM: Filed Vitals:   11/27/11 0819  BP: 100/60  Pulse: 56   General:  Well appearing. No resp difficulty HEENT: normal Neck: supple. JVP hard to see looks 6 -7 . Carotids 2+ bilaterally; no bruits. No lymphadenopathy or thryomegaly appreciated. Cor: PMI normal. Regular rate & rhythm. No rubs, gallops or murmurs. Lungs: clear Abdomen: obese soft, nontender, nondistended. No bruits or masses. Good bowel sounds. Extremities: no cyanosis, clubbing, rash, 1+ ankle edema Neuro: alert & orientedx3, cranial nerves grossly intact. Moves all 4 extremities w/o  difficulty. Affect pleasant.    ECG: Sinus brady 56 No ST-T wave abnormalities.     ASSESSMENT & PLAN:

## 2011-11-27 NOTE — Patient Instructions (Signed)
Your physician wants you to follow-up in: ONE YEAR WITH DR Luanna Cole will receive a reminder letter in the mail two months in advance. If you don't receive a letter, please call our office to schedule the follow-up appointment.   INCREASE FUROSEMIDE 20 MG TAKE ONE TABLET DAILY EXCEPT TWO TABLETS EVERY Monday AND Friday  Your physician has requested that you have an echocardiogram. Echocardiography is a painless test that uses sound waves to create images of your heart. It provides your doctor with information about the size and shape of your heart and how well your heart's chambers and valves are working. This procedure takes approximately one hour. There are no restrictions for this procedure.   Your physician recommends that you return for lab work in: TODAY AND IN ONE MONTH

## 2011-11-27 NOTE — Assessment & Plan Note (Signed)
Blood pressure well controlled. Continue current regimen.  

## 2011-11-27 NOTE — Assessment & Plan Note (Addendum)
Has significant edema. May be related to amlodipine vs diastolic dysfunction. Will place compression stockings. Increase lasix to 40mg  on Monday and Friday. Check echo and BNP. Check BMET in 1 month.

## 2011-12-31 ENCOUNTER — Ambulatory Visit (HOSPITAL_COMMUNITY): Payer: Medicare Other | Attending: Cardiology

## 2011-12-31 ENCOUNTER — Other Ambulatory Visit (INDEPENDENT_AMBULATORY_CARE_PROVIDER_SITE_OTHER): Payer: Medicare Other

## 2011-12-31 DIAGNOSIS — R609 Edema, unspecified: Secondary | ICD-10-CM

## 2011-12-31 DIAGNOSIS — R0609 Other forms of dyspnea: Secondary | ICD-10-CM | POA: Insufficient documentation

## 2011-12-31 DIAGNOSIS — I2510 Atherosclerotic heart disease of native coronary artery without angina pectoris: Secondary | ICD-10-CM | POA: Insufficient documentation

## 2011-12-31 DIAGNOSIS — I517 Cardiomegaly: Secondary | ICD-10-CM | POA: Insufficient documentation

## 2011-12-31 DIAGNOSIS — E785 Hyperlipidemia, unspecified: Secondary | ICD-10-CM | POA: Insufficient documentation

## 2011-12-31 DIAGNOSIS — I10 Essential (primary) hypertension: Secondary | ICD-10-CM | POA: Insufficient documentation

## 2011-12-31 DIAGNOSIS — R0989 Other specified symptoms and signs involving the circulatory and respiratory systems: Secondary | ICD-10-CM | POA: Insufficient documentation

## 2011-12-31 DIAGNOSIS — G4733 Obstructive sleep apnea (adult) (pediatric): Secondary | ICD-10-CM | POA: Insufficient documentation

## 2011-12-31 LAB — BASIC METABOLIC PANEL
BUN: 20 mg/dL (ref 6–23)
CO2: 28 mEq/L (ref 19–32)
Calcium: 9.5 mg/dL (ref 8.4–10.5)
Creatinine, Ser: 1.3 mg/dL (ref 0.4–1.5)
Glucose, Bld: 106 mg/dL — ABNORMAL HIGH (ref 70–99)

## 2011-12-31 NOTE — Progress Notes (Signed)
Echocardiogram performed.  

## 2012-02-05 ENCOUNTER — Telehealth: Payer: Self-pay | Admitting: Pulmonary Disease

## 2012-02-05 MED ORDER — DICYCLOMINE HCL 20 MG PO TABS: 20.0000 mg | ORAL_TABLET | Freq: Three times a day (TID) | ORAL | Status: DC | PRN

## 2012-02-05 NOTE — Telephone Encounter (Signed)
Spoke with pt. He is c/o belly pain and diarrhea x 2 days. Denies any fever, nausea, or vomiting. Pt states that he has not tried any otc meds to help. Also c/o runny nose. I advised that he drink plenty of fluids, try immodium and follow BRAT diet. Try allegra which is on med list for rhinitis and call back if not improving. I advised will let SN know what I have rec for him and if he has any further recs will call back. Pt verbalized understanding and was ok with this. Please advise if any further recs thanks! No Known Allergies

## 2012-02-05 NOTE — Telephone Encounter (Signed)
I spoke with pt and is aware of SN recs. I have sent new RX into the pharmacy for bentyl to [facility]

## 2012-02-05 NOTE — Telephone Encounter (Signed)
Per SN---call in bentyl 20 mg  #50  1 po tid prn abd cramping and immodium otc   1 po every 4 hours as needed for watery diarrhea.  thanks

## 2012-03-03 ENCOUNTER — Ambulatory Visit (INDEPENDENT_AMBULATORY_CARE_PROVIDER_SITE_OTHER): Payer: Medicare Other

## 2012-03-03 DIAGNOSIS — Z23 Encounter for immunization: Secondary | ICD-10-CM

## 2012-03-26 ENCOUNTER — Other Ambulatory Visit: Payer: Self-pay | Admitting: *Deleted

## 2012-03-26 MED ORDER — LABETALOL HCL 200 MG PO TABS: 200.0000 mg | ORAL_TABLET | Freq: Three times a day (TID) | ORAL | Status: DC

## 2012-03-26 MED ORDER — CLOPIDOGREL BISULFATE 75 MG PO TABS: 75.0000 mg | ORAL_TABLET | Freq: Every day | ORAL | Status: DC

## 2012-03-26 MED ORDER — AMLODIPINE BESYLATE 10 MG PO TABS: 10.0000 mg | ORAL_TABLET | Freq: Every day | ORAL | Status: DC

## 2012-03-26 MED ORDER — CLONIDINE HCL 0.1 MG PO TABS: 0.1000 mg | ORAL_TABLET | Freq: Three times a day (TID) | ORAL | Status: DC

## 2012-03-26 MED ORDER — ACYCLOVIR 200 MG PO CAPS: 200.0000 mg | ORAL_CAPSULE | Freq: Three times a day (TID) | ORAL | Status: DC

## 2012-03-26 MED ORDER — FUROSEMIDE 20 MG PO TABS: ORAL_TABLET | ORAL | Status: DC

## 2012-03-28 ENCOUNTER — Other Ambulatory Visit: Payer: Self-pay | Admitting: *Deleted

## 2012-03-28 MED ORDER — PANTOPRAZOLE SODIUM 40 MG PO TBEC: 40.0000 mg | DELAYED_RELEASE_TABLET | Freq: Every day | ORAL | Status: DC

## 2012-03-28 MED ORDER — LISINOPRIL 40 MG PO TABS: 40.0000 mg | ORAL_TABLET | Freq: Every day | ORAL | Status: DC

## 2012-03-28 MED ORDER — LOVASTATIN 40 MG PO TABS: 80.0000 mg | ORAL_TABLET | Freq: Every day | ORAL | Status: DC

## 2012-04-22 ENCOUNTER — Encounter: Payer: Self-pay | Admitting: *Deleted

## 2012-04-23 ENCOUNTER — Encounter: Payer: Self-pay | Admitting: Pulmonary Disease

## 2012-04-23 ENCOUNTER — Ambulatory Visit (INDEPENDENT_AMBULATORY_CARE_PROVIDER_SITE_OTHER)
Admission: RE | Admit: 2012-04-23 | Discharge: 2012-04-23 | Disposition: A | Discharge status: Home / Self Care | Payer: Medicare Other | Source: Ambulatory Visit | Attending: Pulmonary Disease | Admitting: Pulmonary Disease

## 2012-04-23 ENCOUNTER — Other Ambulatory Visit (INDEPENDENT_AMBULATORY_CARE_PROVIDER_SITE_OTHER): Payer: Medicare Other

## 2012-04-23 ENCOUNTER — Ambulatory Visit (INDEPENDENT_AMBULATORY_CARE_PROVIDER_SITE_OTHER): Payer: Medicare Other | Admitting: Pulmonary Disease

## 2012-04-23 VITALS — BP 124/72 | HR 52 | Temp 98.2°F | Ht 69.0 in | Wt 256.6 lb

## 2012-04-23 DIAGNOSIS — K5730 Diverticulosis of large intestine without perforation or abscess without bleeding: Secondary | ICD-10-CM

## 2012-04-23 DIAGNOSIS — I10 Essential (primary) hypertension: Secondary | ICD-10-CM

## 2012-04-23 DIAGNOSIS — M545 Low back pain, unspecified: Secondary | ICD-10-CM

## 2012-04-23 DIAGNOSIS — D126 Benign neoplasm of colon, unspecified: Secondary | ICD-10-CM

## 2012-04-23 DIAGNOSIS — I2510 Atherosclerotic heart disease of native coronary artery without angina pectoris: Secondary | ICD-10-CM

## 2012-04-23 DIAGNOSIS — E669 Obesity, unspecified: Secondary | ICD-10-CM

## 2012-04-23 DIAGNOSIS — N259 Disorder resulting from impaired renal tubular function, unspecified: Secondary | ICD-10-CM

## 2012-04-23 DIAGNOSIS — E785 Hyperlipidemia, unspecified: Secondary | ICD-10-CM

## 2012-04-23 DIAGNOSIS — M1990 Unspecified osteoarthritis, unspecified site: Secondary | ICD-10-CM

## 2012-04-23 DIAGNOSIS — K648 Other hemorrhoids: Secondary | ICD-10-CM

## 2012-04-23 DIAGNOSIS — K219 Gastro-esophageal reflux disease without esophagitis: Secondary | ICD-10-CM

## 2012-04-23 DIAGNOSIS — G4733 Obstructive sleep apnea (adult) (pediatric): Secondary | ICD-10-CM

## 2012-04-23 DIAGNOSIS — M25512 Pain in left shoulder: Secondary | ICD-10-CM

## 2012-04-23 DIAGNOSIS — F411 Generalized anxiety disorder: Secondary | ICD-10-CM

## 2012-04-23 LAB — BASIC METABOLIC PANEL
Calcium: 9.3 mg/dL (ref 8.4–10.5)
Chloride: 107 mEq/L (ref 96–112)
Creatinine, Ser: 1.1 mg/dL (ref 0.4–1.5)
GFR: 69.48 mL/min (ref >60.00)

## 2012-04-23 LAB — LIPID PANEL
LDL Cholesterol: 73 mg/dL (ref 0–99)
Total CHOL/HDL Ratio: 4
Triglycerides: 89 mg/dL (ref 0.0–149.0)

## 2012-04-23 MED ORDER — TRAMADOL HCL 50 MG PO TABS
50.0000 mg | ORAL_TABLET | Freq: Three times a day (TID) | ORAL | Status: DC | PRN
Start: 2012-04-23 — End: 2012-09-09

## 2012-04-23 MED ORDER — HYDROCORTISONE 2.5 % RE CREA: TOPICAL_CREAM | Freq: Two times a day (BID) | RECTAL | Status: AC

## 2012-04-23 NOTE — Patient Instructions (Addendum)
Today we updated your med list in our EPIC system...    Continue your current medications the same...  We wrote a new prescription for TRAMADOL to take up to 3 times daily as needed for arthritis pain...    We will also arrange a consult w/ DrCaffrey regarding your left shoulder pain...  Today we did your follow up CXR & blood work...    We will contact you w/ the results when avail...  Add some FIBER to help your bowels: Fiberall, Metamucil, Citrucel> and take daily...  We also refilled your ANUSOL-HC cream for you hemmorhoids  Call for any questions...  Let's plan a follow up visit in 6 months.Marland KitchenMarland Kitchen

## 2012-04-23 NOTE — Progress Notes (Signed)
Subjective:    Patient ID: [name], male    DOB: 07/27/1945, 66 y.o.   MRN: [id number]  HPI 66 y/o WM here for a follow up visit... he has multiple medical problems including OSA on CPAP;  HBP controlled on 5 drug regimen;  CAD- s/p CABGx4 in 2002 & followed by DrBensimhon;  Hyperlipidemia on Lovastatin;  Obesity & just can't seen to lose the weight;  GERD/ Divertics/ Polyps followed by DrPerry for GI;  DJD & hx hyperuricemia;  Anxiety...  ~  August 14, 2010:  4mo ROV- CC= left shoulder & arm pain x2-3 months & he takes Vicodin w/ relief but hasn't seen Ortho for eval & he will call DrBeane when he is ready;  note- hx hyperuricemia, on Allopurinol 300mg /d & Uric= 5.1 today...    He saw DrPerry 1/12 w/ reflux symptoms, dysphagia, abd pain & rectal bleeding> EGD & Colonoscopy 2/12 showed esoph stricture- dilated, and divertics, hems, & one adenomatous polyp removed (f/u planned 38yrs);  he remains on Protonix daily & Levsin as needed along w/ laxatives Prn.    He saw DrBensimhon 1/12 for Cardiac eval prior to his procedures> felt to be stable, BP controlled, & doing satis overall- OK to hold Plavix for the procedures;  continued on his med regimen w/ Labetolol, Catapress, Amlodipine, Lisinopril, HCTZ, ASA/ Plavix.    He remains on Lovastatin80 for his Chol> FLP today looks good x sl incr TG 153 & low HDL 31;  discussed low fat diet & exercise program (difficult for him due to arthritis).    Otherw stable> we reviewed meds & he requests 90 refills;  Fasting labs reviewed;  last CXR 3/11 showed NAD- s/p CABG, scarring right mid lung zone, spondylosis Tspine...  ~  February 13, 2011:  4mo ROV & c/o allergies, hems, arthritis/ gout > BP controlled on regimen (see below) including HCT but he wants stronger diuretic & we will switch to Lasix20;  Known CAD stable on meds + ASA/Plavix w/o angina; remains on Lovastatin for Chol w/ labs below;  Has not been able to lose weight despite diet counseling etc>  he really need to try harder...    He had an EGD 2/12 by DrPerry (sl stricture- dilated, otherw WNL), & Colonoscopy 2/12 w/ diminutive polyp, severe divertics, & int hems> path= tubular adenoma...    He saw GI/ Amy 6/12 for worsening hems w/ bleeding (on ASA/ Plavix)> Rx Balmex & Analpram 2.5% creams Tid, & f/u w/ DrPerry to see if injection vs surg is needed; Hg= 14.6     He saw DrBensimhon 5/12 for f/u HBP, CAD s/p CABG 2002 & stenting of LAD 2006 & Circ 2003 w/ drug eluting stents> doing well, no angina, exercising some, no changes made; EGK w/ SBrady, NAD...  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  ~  Oct 22, 2011:  4mo ROV & [name] is c/o some diffuse arthritic aches and  pains for which he uses OTC analgesics vs Vicodin;  He had labs done 3/13 (see below)& his PSA was elev at 4.98 representing a marked incr from 1.30 a yr ago> he was treated w/ Doxy100mg Bid x2wks & PSA repeated today= 4.17 so he will need referral to Urology for further eval & poss biopsy...  Kym also had an infected sebaceous cyst on his back I&D'd by [facility] & it has resolved... Finally he has had increased LBP w/ MRI from DrCaffrey showing spondylosis worst at L4-5 w/ extruded disc frag & mod central canal stenosis (see report);  He had ESI 1/13 by DrMattern w/ some improvement; he is sched for f/u w/ DrCaffrey/ DrTooke...  We reviewed his prob list, meds, XRays, & Labs>> LABS 3/13:  FLP-ok on Lova80 x HDL=33;  Chems- ok;  CBC- ok;   TSH=2.29;  PSA=4.98... LABS 5/13:  After Doxy100mg Bid x14d showed PSA=4.17 (pt referred to Urology...  ~  April 23, 2012:  67mo ROV & mult problems reviewed>>    OSA> doing well on CPAP, we couldn't find prev sleep study results, we will attempt to get download report...    HBP> on Labet200Tid, Amlod10, Lisin40, Clonid0.1Tid, Lasix20; BP=124/72 & he denies CP, palpit, ch in SOB, edema, etc...    CAD> on ASA81, Plavix75; followed by DrBensimhon; pt asked to incr exercise, get on diet, etc...    Chol> on Lova80; FLP shows TChol 126, TG 89, HDL 35, LDL 73    Obese> wt is down 6# to 257# w/ BMI in the 37-38 range; we reviewed diet, exercise, wt reduction strategies...    GI> GERD, Divertics, Polyp, Hems> on Protonix40, Levsin0.125, AnusolHC for hems...    GU> Hx RI, elev PSA, ED> renal funct ret to norm & Creat=1.1 now; he had neg prostate bx from DrBorden who continues to follow...    DJD, LBP> on Allopurinol300, Vicodin prn; followed by DrCaffrey & DrTooke; he has had epidural steroid injections for relief...    Anxiety> not currently on anxiolytic Rx... We reviewed prob list, meds, xrays and labs> see below for updates >>  CXR 11/13 showed cardiomeg, s/p median sternotomy/CABG, scar in RML, no airsp dis, multilevel spondylosis... LABS 11/13:  FLP- at goals on Mevacor80;  Chems- wnl...         PROBLEM LIST:    ALLERGIC RHINITIS (ICD-477.9) - advised not to use Afrin etc... rather take OTC antihist (Claritin, Zyrtek, etc), Saline, FLONASE Qhs...  OBSTRUCTIVE SLEEP APNEA (ICD-327.23) - on CPAP regularly (can't locate sleep study results), doing well by his report.  HYPERTENSION (ICD-401.9) - well controlled on a 5 drug regimen: LABETALOL 200mg Tid,  CATAPRES 0.1mg Tid,  AMLODIPINE 10mg /d,  LISINOPRIL 40mg /d,  HCTZ 25mg /d... takes meds regularly and tol well...  ~  CXR 3/11 is clear, WNL (heart upper limit of norm). ~  8/12:  He wants stronger diuretic & we will change HCT to LASIX 20mg /d,  reminded regarding low sodium! ~  10/12:  BP=118/72 & denies HA, fatigue, visual changes, CP, palipit, dizziness, syncope, dyspnea, edema, etc> needs better diet, continue same meds. ~  5/13:  BP= 120/68 & he remains asymptomatic; continue the good work... ~  CXR 11/13 showed cardiomeg, s/p median sternotomy/CABG, scar in RML, no airsp dis, multilevel spondylosis...   CORONARY ARTERY DISEASE (ICD-414.00) - on above meds + ASA 81mg /d & PLAVIX 75mg /d... S/P CABG x4 9/02 by DrOwen... subseq PTCA/stent to native CIRC 6/03 (& occluded vein graft to LAD noted); last cath= 2/06 w/ PTCA/stent in LAD  distal to LIMA touchdown, otherw no change... NuclearStressTest 9/07 showed neg w/ EF=62%... ~  3/10:  yearly ROV DrBensimhon doing well, no changes made... ~  3/11:  stable- StressTest was neg- no signif ST seg depression; ArtDopplers LEs showed norm ABIs. ~  He continues to f/u w/ DrBensimhon yearly> seen 6/13- noted incr edema & he rec no salt, compression hose, incr Lasix... ~  EKG 6/13 showed SBrady, rate56, otherw wnl, NAD... ~  2DEcho 7/13 showed mild LVH, norm LVF w/ EF=55-60% & no regional wall motion abn, Gr2DD, valves-ok, modLAdil, norm RV...  HYPERLIPIDEMIA (ICD-272.4) - back on LOVASTATIN 80mg /d (40mg - 2tabsQhs) due to $$$... + FISH OIL 3/d... ~  FLP 7/08 on Lovastatin showed TChol 157, TG 127, HDL 30, LDL 101... ch to LIP40 per Cards... ~  FLP 1/09 on Lip40 showed TChol 125, Tg 105, HDL 31, LDL 73... ch back to Lovastin80 due to $$$... ~  FLP 7/09 on Lova80 showed TChol 142, TG 111, HDL 31, LDL 89... rec- continue same, get wt down. ~  FLP 1/10 showed TChol 130, TG 99, HDL 30, LDL 80... rec> get wt down or stronger statin Rx! ~  FLP 3/11 showed TChol 138, TG 96, HDL 43, LDL 76... continue same. ~  FLP 8/11 showed TChol 140, TG 145, HDL 33, LDL 78 ~  FLP 8/12 on Lova80 showed TChol 146, TG 114, HDL 42, LDL 81 ~  FLP 3/13 on Lova80 showed TChol 118, TG 100, HDL 33, LDL 65  OBESITY  (EAV-409.81) ~  7/09:  he's done a better job w/ diet + exercise and weight down 13# in 37mo to 242#. ~  1/10:  unfortunately weight back up 11# over the holidays to 253#... rec- diet/ exercise discussed. ~  10/10: weight = 260# but pt notes he & wife to start diet soon!!! ~  3/11:  weight = 260# ~  8/11:  weight = 251#.Marland Kitchen. states "my wife keeps on bringing it home" ~  8/12:  Weight = 257#... We reviewed diet & exercise prescriptions! ~  5/13:  Weight = 263#  GERD (ICD-530.81) - last EGD by DrSam 3/06 revealed severe antral gastitis.Marland Kitchen. now on PROTONIX 40mg /d, and PEPCID 20mg Qhs... ~  EGD 2/12 by DrPerry (sl stricture- dilated, otherw WNL)...  DIVERTICULOSIS OF COLON (ICD-562.10) & COLONIC POLYPS (ICD-211.3)  ~  Colonoscopy 7/05 by DrSam showed divertics, hems, otherw neg... f/u planned 1yrs. ~  Colonoscopy 2/12 w/ diminutive polyp, severe divertics, & int hems> path= tubular adenoma...  RENAL INSUFFICIENCY (ICD-588.9) - he knows to avoid NSAIDs and nephrotoxic drugs... 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 ~  Baseline PSA for yrs was betw 1.08 & 1.83 ~  Labs 3/13 showed PSA= 4.98 & he was treated w/ Doxy100mg  Bid x14d... ~  Labs 5/13 showed PSA= 4.17 & he was referred to Urology for further eval... ~  5/13:  Pt requested Rx for Viagra 100mg  & we reviewed precautions & contraindications... ~  6/13:  He was eval by drBorden & had Prostate bx- pt reports that this was neg (we do not have f/u note from Urology)...  DEGENERATIVE JOINT DISEASE (  ICD-715.90) & HYPERURICEMIA (ICD-790.6) - as per eval DrBeane w/ ?gout- Rx'd w/ left knee shot, Indocin... back on ALLOPURINOL 300mg /d now... ~  labs 3/09  showed Uric= 8.7... ~  labs 7/09 showed Uric= 5.6.Marland KitchenMarland Kitchen rec- continue Allopurinol. ~  9/10: dropped timber on left foot w/ hematoma, cellulitis- Rx'd... ~  labs 9/10 showed Uric= 5.9 on Uloric40. ~  labs 3/11 showed URIC= 6.3 back on Allopurinol 300mg /d. ~  Labs 8/12 showed Uric = 6.2  BACK PAIN, LUMBAR (ICD-724.2) >> followed by DrCaffrey & DrTooke w/ MRIs etc... He has had a series of ESI injections, + VICODIN prn use... ~  He continues to be followed by DrCaffrey & DrTooke for Ortho...  ANXIETY (ICD-300.00)   Past Surgical History  Procedure Date  . Coronary artery bypass graft   . Angioplasty   . Cholecystectomy 2006    [place]    Outpatient Encounter Prescriptions as of 04/23/2012  Medication Sig Dispense Refill  . acyclovir (ZOVIRAX) 200 MG capsule Take 1 capsule (200 mg total) by mouth 3 (three) times daily.  270 capsule  3  . allopurinol (ZYLOPRIM) 300 MG tablet Take 1 tablet (300 mg total) by mouth daily.  90 tablet  3  . amLODipine (NORVASC) 10 MG tablet Take 1 tablet (10 mg total) by mouth daily.  90 tablet  3  . aspirin 81 MG tablet Take 81 mg by mouth daily.        . cloNIDine (CATAPRES) 0.1 MG tablet Take 1 tablet (0.1 mg total) by mouth 3 (three) times daily.  270 tablet  3  . clopidogrel (PLAVIX) 75 MG tablet Take 1 tablet (75 mg total) by mouth daily.  90 tablet  3  . dicyclomine (BENTYL) 20 MG tablet Take 1 tablet (20 mg total) by mouth 3 (three) times daily as needed.  50 tablet  0  . fexofenadine (ALLEGRA) 180 MG tablet Take 1 tablet (180 mg total) by mouth daily.  90 tablet  3  . fish oil-omega-3 fatty acids 1000 MG capsule Take 2 g by mouth daily.        . furosemide (LASIX) 20 MG tablet One tablet daily except two tablets on Monday and friday  180 tablet  3  . HYDROcodone-acetaminophen (VICODIN) 5-500 MG per tablet Take 1 tablet by mouth every 6 (six) hours as needed.  100 tablet  1  . hyoscyamine (LEVSIN SL) 0.125 MG SL tablet Place 1 tablet (0.125 mg total)  under the tongue every 4 (four) hours as needed.  90 tablet  3  . labetalol (NORMODYNE) 200 MG tablet Take 1 tablet (200 mg total) by mouth 3 (three) times daily.  270 tablet  3  . lisinopril (PRINIVIL,ZESTRIL) 40 MG tablet Take 1 tablet (40 mg total) by mouth daily.  90 tablet  3  . lovastatin (MEVACOR) 40 MG tablet Take 2 tablets (80 mg total) by mouth at bedtime.  180 tablet  3  . pantoprazole (PROTONIX) 40 MG tablet Take 1 tablet (40 mg total) by mouth daily.  90 tablet  3  . sildenafil (VIAGRA) 100 MG tablet Take as directed  10 tablet  5  . [DISCONTINUED] famotidine (PEPCID AC) 10 MG chewable tablet Chew 10 mg by mouth 2 (two) times daily.          No Known Allergies   Current Medications, Allergies, Past Medical History, Past Surgical History, Family History, and Social History were reviewed in [employer] record.     Review of  Systems         See HPI - all other systems neg except as noted...  The patient complains of dyspnea on exertion and difficulty walking.  The patient denies anorexia, fever, weight loss, weight gain, vision loss, decreased hearing, hoarseness, chest pain, syncope, peripheral edema, prolonged cough, headaches, hemoptysis, abdominal pain, melena, hematochezia, severe indigestion/heartburn, hematuria, incontinence, muscle weakness, suspicious skin lesions, transient blindness, depression, unusual weight change, abnormal bleeding, enlarged lymph nodes, and angioedema.   Objective:   Physical Exam    WD, Obese, 66 y/o WM in NAD... GENERAL:  Alert & oriented; pleasant & cooperative... HEENT:  [place]/AT, EOM-wnl, PERRLA, EACs-clear, TMs-wnl, NOSE-clear, THROAT-clear & wnl. NECK:  Supple w/ fairROM; no JVD; normal carotid impulses w/o bruits; no thyromegaly or nodules palpated; no lymphadenopathy. CHEST:  Median sternotomy scar, clear to P & A; without wheezes/ rales/ or rhonchi. HEART:  Regular Rhythm; without murmurs/ rubs/ or gallops  heard. ABDOMEN:  Obese, soft & nontender; normal bowel sounds; no organomegaly or masses detected. EXT: without deformities, mild arthritic changes; no varicose veins/ +venous insuffic, tr edema... NEURO:  CN's intact; motor testing normal; sensory testing normal; gait normal & balance OK. DERM:  No lesions noted; no rash etc...  RADIOLOGY DATA:  Reviewed in the EPIC EMR & discussed w/ the patient...  LABORATORY DATA:  Reviewed in the EPIC EMR & discussed w/ the patient...   Assessment & Plan:    AR>  rec to use an Antihist Qam, Saline nasal mist during the days, and Flonase Qhs...  OSA>  He reports stable on CPAP w/o daytime hypersomnolence etc...  HBP>  Controlled on his regimen, & tolerating the Lasix well...  CAD, s/p CABG & subseq PCI/ stents>  Stable on BP meds + ASA/Plavix; followed by DrBensimhon...  HYPERLIPID>  FLP remains close to goals on diet + Lovastatin80, unfortunately he hasn't lost any weight...  OBESITY>  We reviewed diet + exercise prescription...  GI> GERD, Divertics, Polyps>  recent EGD & colon reviewed w/ the pt...  GU> elevated PSA w/ min improvement after antibiotic Rx; refer to Urology for further eval...  DJD/ GOUT/ LBP>  Prev URIC level = 6.2 on the Allopurinol daily;  He had LBP eval by DrTooke & Caffrey w/ one ESI so far...  Anxiety>  He does not want anxiolytic Rx...   Patient's Medications  New Prescriptions   HYDROCORTISONE (ANUSOL-HC) 2.5 % RECTAL CREAM    Place rectally 2 (two) times daily. As directed   TRAMADOL (ULTRAM) 50 MG TABLET    Take 1 tablet (50 mg total) by mouth 3 (three) times daily as needed (for arthritis pain).  Previous Medications   ACYCLOVIR (ZOVIRAX) 200 MG CAPSULE    Take 1 capsule (200 mg total) by mouth 3 (three) times daily.   ALLOPURINOL (ZYLOPRIM) 300 MG TABLET    Take 1 tablet (300 mg total) by mouth daily.   AMLODIPINE (NORVASC) 10 MG TABLET    Take 1 tablet (10 mg total) by mouth daily.   ASPIRIN EC 81 MG  TABLET    Take 81 mg by mouth every evening.   CHLORPHENIRAMINE-DM (CORICIDIN HBP COUGH/COLD PO)    Take 1 tablet by mouth every 6 (six) hours as needed. For cough   CLONIDINE (CATAPRES) 0.1 MG TABLET    Take 1 tablet (0.1 mg total) by mouth 3 (three) times daily.   CLOPIDOGREL (PLAVIX) 75 MG TABLET    Take 1 tablet (75 mg total) by mouth daily.  FEXOFENADINE (ALLEGRA) 180 MG TABLET    Take 1 tablet (180 mg total) by mouth daily.   FISH OIL-OMEGA-3 FATTY ACIDS 1000 MG CAPSULE    Take 1 g by mouth 2 (two) times daily.    FUROSEMIDE (LASIX) 20 MG TABLET    Take 20-40 mg by mouth daily. 2 tablets (40 mg) on Monday and Friday, 1 tablet (20 mg) on all other days   HYDROCODONE-ACETAMINOPHEN (VICODIN) 5-500 MG PER TABLET    Take 1 tablet by mouth every 6 (six) hours as needed. For pain   LABETALOL (NORMODYNE) 200 MG TABLET    Take 1 tablet (200 mg total) by mouth 3 (three) times daily.   LISINOPRIL (PRINIVIL,ZESTRIL) 40 MG TABLET    Take 1 tablet (40 mg total) by mouth daily.   LOVASTATIN (MEVACOR) 40 MG TABLET    Take 2 tablets (80 mg total) by mouth at bedtime.   MULTIPLE VITAMIN (MULTIVITAMIN WITH MINERALS) TABS    Take 1 tablet by mouth daily.   OVER THE COUNTER MEDICATION    Place 1 drop into both eyes 2 (two) times daily. Lubricant eye drops for dry eyes   PANTOPRAZOLE (PROTONIX) 40 MG TABLET    Take 1 tablet (40 mg total) by mouth daily.  Modified Medications   No medications on file  Discontinued Medications   ASPIRIN 81 MG TABLET    Take 81 mg by mouth daily.     DICYCLOMINE (BENTYL) 20 MG TABLET    Take 1 tablet (20 mg total) by mouth 3 (three) times daily as needed.   FAMOTIDINE (PEPCID AC) 10 MG CHEWABLE TABLET    Chew 10 mg by mouth 2 (two) times daily.     FUROSEMIDE (LASIX) 20 MG TABLET    One tablet daily except two tablets on Monday and friday   HYDROCODONE-ACETAMINOPHEN (VICODIN) 5-500 MG PER TABLET    Take 1 tablet by mouth every 6 (six) hours as needed.   HYOSCYAMINE (LEVSIN SL)  0.125 MG SL TABLET    Place 1 tablet (0.125 mg total) under the tongue every 4 (four) hours as needed.   SILDENAFIL (VIAGRA) 100 MG TABLET    Take as directed

## 2012-04-24 ENCOUNTER — Emergency Department (HOSPITAL_COMMUNITY)
Admission: EM | Admit: 2012-04-24 | Discharge: 2012-04-25 | Disposition: A | Discharge status: Home / Self Care | Payer: Medicare Other | Attending: Emergency Medicine | Admitting: Emergency Medicine

## 2012-04-24 ENCOUNTER — Encounter (HOSPITAL_COMMUNITY): Payer: Self-pay | Admitting: Emergency Medicine

## 2012-04-24 DIAGNOSIS — K5730 Diverticulosis of large intestine without perforation or abscess without bleeding: Secondary | ICD-10-CM | POA: Insufficient documentation

## 2012-04-24 DIAGNOSIS — K2940 Chronic atrophic gastritis without bleeding: Secondary | ICD-10-CM | POA: Insufficient documentation

## 2012-04-24 DIAGNOSIS — N289 Disorder of kidney and ureter, unspecified: Secondary | ICD-10-CM | POA: Insufficient documentation

## 2012-04-24 DIAGNOSIS — M1990 Unspecified osteoarthritis, unspecified site: Secondary | ICD-10-CM | POA: Insufficient documentation

## 2012-04-24 DIAGNOSIS — Z79899 Other long term (current) drug therapy: Secondary | ICD-10-CM | POA: Insufficient documentation

## 2012-04-24 DIAGNOSIS — E785 Hyperlipidemia, unspecified: Secondary | ICD-10-CM | POA: Insufficient documentation

## 2012-04-24 DIAGNOSIS — F411 Generalized anxiety disorder: Secondary | ICD-10-CM | POA: Insufficient documentation

## 2012-04-24 DIAGNOSIS — Z87891 Personal history of nicotine dependence: Secondary | ICD-10-CM | POA: Insufficient documentation

## 2012-04-24 DIAGNOSIS — B009 Herpesviral infection, unspecified: Secondary | ICD-10-CM | POA: Insufficient documentation

## 2012-04-24 DIAGNOSIS — K219 Gastro-esophageal reflux disease without esophagitis: Secondary | ICD-10-CM | POA: Insufficient documentation

## 2012-04-24 DIAGNOSIS — R079 Chest pain, unspecified: Secondary | ICD-10-CM | POA: Insufficient documentation

## 2012-04-24 DIAGNOSIS — Z951 Presence of aortocoronary bypass graft: Secondary | ICD-10-CM | POA: Insufficient documentation

## 2012-04-24 DIAGNOSIS — I2510 Atherosclerotic heart disease of native coronary artery without angina pectoris: Secondary | ICD-10-CM | POA: Insufficient documentation

## 2012-04-24 DIAGNOSIS — Z7901 Long term (current) use of anticoagulants: Secondary | ICD-10-CM | POA: Insufficient documentation

## 2012-04-24 DIAGNOSIS — I10 Essential (primary) hypertension: Secondary | ICD-10-CM | POA: Insufficient documentation

## 2012-04-24 DIAGNOSIS — K222 Esophageal obstruction: Secondary | ICD-10-CM | POA: Insufficient documentation

## 2012-04-24 DIAGNOSIS — E669 Obesity, unspecified: Secondary | ICD-10-CM | POA: Insufficient documentation

## 2012-04-24 DIAGNOSIS — N529 Male erectile dysfunction, unspecified: Secondary | ICD-10-CM | POA: Insufficient documentation

## 2012-04-24 DIAGNOSIS — J309 Allergic rhinitis, unspecified: Secondary | ICD-10-CM | POA: Insufficient documentation

## 2012-04-24 DIAGNOSIS — G4733 Obstructive sleep apnea (adult) (pediatric): Secondary | ICD-10-CM | POA: Insufficient documentation

## 2012-04-24 DIAGNOSIS — R7989 Other specified abnormal findings of blood chemistry: Secondary | ICD-10-CM | POA: Insufficient documentation

## 2012-04-24 DIAGNOSIS — Z8669 Personal history of other diseases of the nervous system and sense organs: Secondary | ICD-10-CM | POA: Insufficient documentation

## 2012-04-24 LAB — BASIC METABOLIC PANEL
CO2: 24 mEq/L (ref 19–32)
Chloride: 103 mEq/L (ref 96–112)
GFR calc Af Amer: 77 mL/min — ABNORMAL LOW (ref >90)
Sodium: 139 mEq/L (ref 135–145)

## 2012-04-24 LAB — CBC
Platelets: 246 10*3/uL (ref 150–400)
RBC: 5.12 MIL/uL (ref 4.22–5.81)
RDW: 14.5 % (ref 11.5–15.5)
WBC: 11.7 10*3/uL — ABNORMAL HIGH (ref 4.0–10.5)

## 2012-04-24 LAB — TROPONIN I: Troponin I: <0.3 ng/mL (ref <0.30)

## 2012-04-24 LAB — POCT I-STAT TROPONIN I
Troponin i, poc: 0 ng/mL (ref 0.00–0.08)
Troponin i, poc: 0 ng/mL (ref 0.00–0.08)

## 2012-04-24 NOTE — ED Notes (Signed)
Pt st's onset of chest pain earlier today.  St's his b/p was elevated and then developed chest pain.  Pt st's he took 1 extra blood pressure pill tonight.  [name] shortness of breath.

## 2012-04-24 NOTE — ED Provider Notes (Signed)
History    CSN: [id number] Arrival date & time 04/24/12  2031 First MD Initiated Contact with Patient 04/24/12 2210     Chief Complaint  Patient presents with  . Chest Pain   HPI Comments: Known history of CAD:   s/p CABG 2002 and stenting of LAD (2006) and LCX (2003) with drug-eluting stents.  Earlier today he did have a corisone injection in the shoulder   The history is provided by the patient.  This evening he started to feel somewhat dizzy and clammy.  He checked his blood pressure and it was elevated.  He also developed pain that was sharp in nature.  It lasted off and on for about 45 minutes. He also started burping a lot.  He has acid reflex but he also felt some pressure in his chest.  He cannot really differentiate sometimes between GERD symptoms and prior heart symptoms. He took an extra labetalol because his BP was elevated when he checked it.  Pt decided to come to the ED.He feels fine now.  No shortness of breath.  No vomiting or diarrhea.    Past Medical History  Diagnosis Date  . CAD (coronary artery disease)     s/p CABG  . Allergic rhinitis   . OSA (obstructive sleep apnea)   . HTN (hypertension)   . Hyperlipidemia   . Obesity   . GERD (gastroesophageal reflux disease)   . Diverticulosis of colon   . Personal history of colonic polyps 07/2010    tublar adenoma  . Renal insufficiency   . Organic impotence   . DJD (degenerative joint disease)   . Lumbar back pain   . Hyperuricemia   . Anxiety   . Hemorrhoids   . Chronic gastritis   . Herpes   . Esophageal stricture   . Pinched nerve     back    Past Surgical History  Procedure Date  . Coronary artery bypass graft   . Angioplasty   . Cholecystectomy 2006    [facility]    Family History  Problem Relation Age of Onset  . Heart disease Mother   . Diabetes Mother   . Heart disease Father   . Heart disease Brother   . Colon cancer Neg Hx   . Diabetes Brother     History  Substance Use Topics  .  Smoking status: Former Smoker -- 0.5 packs/day for 15 years    Types: Cigarettes    Quit date: 06/18/2000  . Smokeless tobacco: Never Used  . Alcohol Use: 7.0 oz/week    14 drink(s) per week     Comment: 2 drinks per day red wine      Review of Systems  Cardiovascular: Positive for chest pain.  All other systems reviewed and are negative.    Allergies  Review of patient's allergies indicates no known allergies.  Home Medications   Current Outpatient Rx  Name  Route  Sig  Dispense  Refill  . ACYCLOVIR 200 MG PO CAPS   Oral   Take 1 capsule (200 mg total) by mouth 3 (three) times daily.   270 capsule   3   . ALLOPURINOL 300 MG PO TABS   Oral   Take 1 tablet (300 mg total) by mouth daily.   90 tablet   3   . AMLODIPINE BESYLATE 10 MG PO TABS   Oral   Take 1 tablet (10 mg total) by mouth daily.   90 tablet   3   .  CLONIDINE HCL 0.1 MG PO TABS   Oral   Take 1 tablet (0.1 mg total) by mouth 3 (three) times daily.   270 tablet   3   . CLOPIDOGREL BISULFATE 75 MG PO TABS   Oral   Take 1 tablet (75 mg total) by mouth daily.   90 tablet   3   . FEXOFENADINE HCL 180 MG PO TABS   Oral   Take 1 tablet (180 mg total) by mouth daily.   90 tablet   3   . OMEGA-3 FATTY ACIDS 1000 MG PO CAPS   Oral   Take 2 g by mouth daily.           Marland Kitchen HYDROCODONE-ACETAMINOPHEN 5-500 MG PO TABS   Oral   Take 1 tablet by mouth every 6 (six) hours as needed.   100 tablet   1   . HYDROCORTISONE 2.5 % RE CREA   Rectal   Place rectally 2 (two) times daily. As directed   30 g   6   . HYOSCYAMINE SULFATE 0.125 MG SL SUBL   Sublingual   Place 1 tablet (0.125 mg total) under the tongue every 4 (four) hours as needed.   90 tablet   3   . LABETALOL HCL 200 MG PO TABS   Oral   Take 1 tablet (200 mg total) by mouth 3 (three) times daily.   270 tablet   3   . LISINOPRIL 40 MG PO TABS   Oral   Take 1 tablet (40 mg total) by mouth daily.   90 tablet   3   . LOVASTATIN 40  MG PO TABS   Oral   Take 2 tablets (80 mg total) by mouth at bedtime.   180 tablet   3   . PANTOPRAZOLE SODIUM 40 MG PO TBEC   Oral   Take 1 tablet (40 mg total) by mouth daily.   90 tablet   3   . SILDENAFIL CITRATE 100 MG PO TABS      Take as directed   10 tablet   5   . TRAMADOL HCL 50 MG PO TABS   Oral   Take 1 tablet (50 mg total) by mouth 3 (three) times daily as needed (for arthritis pain).   90 tablet   5     BP 144/65  Pulse 62  Temp 98 F (36.7 C) (Oral)  Resp 17  SpO2 98%  Physical Exam  Nursing note and vitals reviewed. Constitutional: He appears well-developed and well-nourished. No distress.  HENT:  Head: Normocephalic and atraumatic.  Right Ear: External ear normal.  Left Ear: External ear normal.  Eyes: Conjunctivae normal are normal. Right eye exhibits no discharge. Left eye exhibits no discharge. No scleral icterus.  Neck: Neck supple. No tracheal deviation present.  Cardiovascular: Normal rate, regular rhythm and intact distal pulses.   Pulmonary/Chest: Effort normal and breath sounds normal. No stridor. No respiratory distress. He has no wheezes. He has no rales.  Abdominal: Soft. Bowel sounds are normal. He exhibits no distension. There is no tenderness. There is no rebound and no guarding.  Musculoskeletal: He exhibits edema (bilateral, mild). He exhibits no tenderness.  Neurological: He is alert. He has normal strength. No sensory deficit. Cranial nerve deficit:  no gross defecits noted. He exhibits normal muscle tone. He displays no seizure activity. Coordination normal.  Skin: Skin is warm and dry. No rash noted.  Psychiatric: He has a normal mood and affect.  ED Course  Procedures (including critical care time) EKG Rate 75 Normal sinus rhythm Rightward axis Borderline ECG Labs Reviewed  CBC - Abnormal; Notable for the following:    WBC 11.7 (*)     All other components within normal limits  BASIC METABOLIC PANEL - Abnormal;  Notable for the following:    Glucose, Bld 140 (*)     GFR calc non Af Amer 67 (*)     GFR calc Af Amer 77 (*)     All other components within normal limits  TROPONIN I  POCT I-STAT TROPONIN I  POCT I-STAT TROPONIN I  TROPONIN I   Dg Chest 2 View  04/23/2012  *RADIOLOGY REPORT*  Clinical Data: Hypertension  CHEST - 2 VIEW  Comparison: None  Findings:  Heart size is moderately enlarged.  The patient is status post median sternotomy and CABG procedure.  No pleural effusion or edema.  No airspace consolidation.  Scar like density is noted within the right midlung. There is mild multilevel spondylosis within the thoracic spine.  IMPRESSION:  1.  Cardiac enlargement. 2.  No acute findings.   Original Report Authenticated By: Signa Kell, M.D.       MDM  Pt has history of CAD however his symptoms were atypical for cardiac etiology this evening.  He has been doing well without problems prior to tonight.  Symptoms may have been related to his GERD considering the burping he experienced.    Will plan on 2 sets of troponin.  If normal, pt will follow up closely with his cardiologist.  Case discussed with cardiologist on call.  Family and patient agree to this plan.        Celene Kras, MD 04/24/12 [phone]

## 2012-05-01 ENCOUNTER — Telehealth: Payer: Self-pay | Admitting: Pulmonary Disease

## 2012-05-01 NOTE — Telephone Encounter (Signed)
Result Note     Please notify patient>    FLP looks ok on Mevacor80> continue same + low chol, low fat diet...   Chems> wnl, continue same Rx...   Result Note     Please notify patient>    CXR shows cardiomeg, s/p CABG, clear lungs x scar tissue, & DJD TSpine...   I spoke with patient about results and he verbalized understanding and had no questions.

## 2012-05-01 NOTE — Telephone Encounter (Signed)
Called and spoke with pt and he is aware that PSA was not rechecked by SN.  He stated that he had a biopsy done about 1 month ago and wanted to see if his numbers came down.  He stated that he has appt with Urology in [place] and stated that they will recheck then.  Nothing further is needed.

## 2012-05-01 NOTE — Telephone Encounter (Signed)
Please advise results thanks 

## 2012-07-11 ENCOUNTER — Telehealth: Payer: Self-pay | Admitting: Pulmonary Disease

## 2012-07-11 MED ORDER — ALLOPURINOL 300 MG PO TABS: 300.0000 mg | ORAL_TABLET | Freq: Every day | ORAL | Status: DC

## 2012-07-11 NOTE — Telephone Encounter (Signed)
Pt stated that [facility] forgot to ship his allopurinol to him in his shipment. They will get this out to him and advised him it will take up to 10 days. He is needing a 10 day supply sent to [facility] to hold him over. i have done so and nothing further was needed

## 2012-08-11 ENCOUNTER — Telehealth: Payer: Self-pay | Admitting: Pulmonary Disease

## 2012-08-11 MED ORDER — MAGIC MOUTHWASH: 5.0000 mL | Freq: Four times a day (QID) | ORAL | Status: DC | PRN

## 2012-08-11 MED ORDER — AZITHROMYCIN 250 MG PO TABS: 250.0000 mg | ORAL_TABLET | Freq: Every day | ORAL | Status: AC

## 2012-08-11 NOTE — Telephone Encounter (Signed)
Spoke with patient- states for 2 days he has been having slight cough-white to clear in color, runny nose, PND, and sore throat. Denies any wheezing/SOB, nausea or vomiting. Pt would like Rx called to pharmacy or other recs SN may have. Please advise. Thanks.

## 2012-08-11 NOTE — Telephone Encounter (Signed)
Pt aware that Rx's have been sent to the pharmacy and states he will start back on Allegra 180mg  qd as well as use Saline nasal spray.

## 2012-08-11 NOTE — Telephone Encounter (Signed)
Per SN----  Ok to send in zpak #1  Take as directed MMW  1 tsp gargle and swallow four times daily as needed Allegra 180 mg  1 daily Nasal saline every 1-2 hours while awake.  thanks

## 2012-08-27 ENCOUNTER — Telehealth: Payer: Self-pay | Admitting: Pulmonary Disease

## 2012-08-27 NOTE — Telephone Encounter (Signed)
I spoke with pt. I advised him he needed to give his urologist Dr. Dorothey Baseman a call and get recs if he is having increase blood in the urine. He voiced his understanding and will do so now. Nothing furthing was needed and will forward to SN as FYI

## 2012-08-28 ENCOUNTER — Telehealth: Payer: Self-pay | Admitting: Pulmonary Disease

## 2012-08-28 ENCOUNTER — Other Ambulatory Visit: Payer: Self-pay | Admitting: Pulmonary Disease

## 2012-08-28 MED ORDER — FLUTICASONE PROPIONATE 50 MCG/ACT NA SUSP: 2.0000 | Freq: Every day | NASAL | Status: AC

## 2012-08-28 NOTE — Telephone Encounter (Signed)
Spoke with pt aware that rx for flonase has been sent over. . Nothing futher needed.

## 2012-09-08 ENCOUNTER — Telehealth: Payer: Self-pay | Admitting: Pulmonary Disease

## 2012-09-08 NOTE — Telephone Encounter (Signed)
Returning call can be reached at [phone].[name]

## 2012-09-08 NOTE — Telephone Encounter (Signed)
Spoke with patient, patient requesting [place] call Dr. Eula ListenCheree Ditto at Fax [phone]-(ph#[phone]) and get last OV note and notes regarding patient positive biopsy. I have spoke with receptionist at this office and she is faxing patients notes over to triage fax for Dr. Kriste Basque to have when patient comes tomorrow. Nothing further needed at this time.

## 2012-09-08 NOTE — Telephone Encounter (Signed)
Pt is calling back.  Please return pt's call at [phone].  [name]

## 2012-09-08 NOTE — Telephone Encounter (Signed)
ATC patient, no answer LMOMTCB 

## 2012-09-09 ENCOUNTER — Ambulatory Visit (INDEPENDENT_AMBULATORY_CARE_PROVIDER_SITE_OTHER): Payer: Medicare Other | Admitting: Pulmonary Disease

## 2012-09-09 ENCOUNTER — Encounter: Payer: Self-pay | Admitting: Pulmonary Disease

## 2012-09-09 ENCOUNTER — Other Ambulatory Visit (INDEPENDENT_AMBULATORY_CARE_PROVIDER_SITE_OTHER): Payer: Medicare Other

## 2012-09-09 VITALS — BP 120/82 | HR 48 | Temp 98.1°F | Ht 69.0 in | Wt 251.4 lb

## 2012-09-09 DIAGNOSIS — E785 Hyperlipidemia, unspecified: Secondary | ICD-10-CM

## 2012-09-09 DIAGNOSIS — N4231 Prostatic intraepithelial neoplasia: Secondary | ICD-10-CM

## 2012-09-09 DIAGNOSIS — I2510 Atherosclerotic heart disease of native coronary artery without angina pectoris: Secondary | ICD-10-CM

## 2012-09-09 DIAGNOSIS — M545 Low back pain: Secondary | ICD-10-CM

## 2012-09-09 DIAGNOSIS — D4959 Neoplasm of unspecified behavior of other genitourinary organ: Secondary | ICD-10-CM

## 2012-09-09 DIAGNOSIS — I10 Essential (primary) hypertension: Secondary | ICD-10-CM

## 2012-09-09 DIAGNOSIS — R7309 Other abnormal glucose: Secondary | ICD-10-CM

## 2012-09-09 DIAGNOSIS — D126 Benign neoplasm of colon, unspecified: Secondary | ICD-10-CM

## 2012-09-09 DIAGNOSIS — C4492 Squamous cell carcinoma of skin, unspecified: Secondary | ICD-10-CM | POA: Insufficient documentation

## 2012-09-09 DIAGNOSIS — E669 Obesity, unspecified: Secondary | ICD-10-CM

## 2012-09-09 DIAGNOSIS — G4733 Obstructive sleep apnea (adult) (pediatric): Secondary | ICD-10-CM

## 2012-09-09 DIAGNOSIS — K5730 Diverticulosis of large intestine without perforation or abscess without bleeding: Secondary | ICD-10-CM

## 2012-09-09 DIAGNOSIS — F411 Generalized anxiety disorder: Secondary | ICD-10-CM

## 2012-09-09 DIAGNOSIS — R972 Elevated prostate specific antigen [PSA]: Secondary | ICD-10-CM

## 2012-09-09 DIAGNOSIS — M1990 Unspecified osteoarthritis, unspecified site: Secondary | ICD-10-CM

## 2012-09-09 DIAGNOSIS — R7989 Other specified abnormal findings of blood chemistry: Secondary | ICD-10-CM

## 2012-09-09 DIAGNOSIS — K219 Gastro-esophageal reflux disease without esophagitis: Secondary | ICD-10-CM

## 2012-09-09 LAB — LIPID PANEL
Total CHOL/HDL Ratio: 3
VLDL: 16 mg/dL (ref 0.0–40.0)

## 2012-09-09 LAB — BASIC METABOLIC PANEL
CO2: 29 mEq/L (ref 19–32)
GFR: 67.99 mL/min (ref >60.00)
Glucose, Bld: 87 mg/dL (ref 70–99)
Potassium: 4.7 mEq/L (ref 3.5–5.1)
Sodium: 139 mEq/L (ref 135–145)

## 2012-09-09 LAB — CBC WITH DIFFERENTIAL/PLATELET
Basophils Absolute: 0 10*3/uL (ref 0.0–0.1)
Eosinophils Absolute: 0.4 10*3/uL (ref 0.0–0.7)
HCT: 44.1 % (ref 39.0–52.0)
Hemoglobin: 14.6 g/dL (ref 13.0–17.0)
Lymphs Abs: 1.8 10*3/uL (ref 0.7–4.0)
MCHC: 33.1 g/dL (ref 30.0–36.0)
Monocytes Absolute: 0.8 10*3/uL (ref 0.1–1.0)
Monocytes Relative: 8.9 % (ref 3.0–12.0)
Neutro Abs: 6.3 10*3/uL (ref 1.4–7.7)
Platelets: 243 10*3/uL (ref 150.0–400.0)
RDW: 15.5 % — ABNORMAL HIGH (ref 11.5–14.6)

## 2012-09-09 LAB — HEPATIC FUNCTION PANEL
AST: 22 U/L (ref 0–37)
Albumin: 4.4 g/dL (ref 3.5–5.2)
Alkaline Phosphatase: 80 U/L (ref 39–117)
Bilirubin, Direct: 0.2 mg/dL (ref 0.0–0.3)

## 2012-09-09 LAB — TSH: TSH: 2.19 u[IU]/mL (ref 0.35–5.50)

## 2012-09-09 MED ORDER — TRAMADOL HCL 50 MG PO TABS: 50.0000 mg | ORAL_TABLET | Freq: Three times a day (TID) | ORAL | Status: DC | PRN

## 2012-09-09 NOTE — Progress Notes (Signed)
Subjective:    Patient ID: [name], male    DOB: 08-Aug-1945, 67 y.o.   MRN: [id number]  HPI 67 y/o WM here for a follow up visit... he has multiple medical problems including OSA on CPAP;  HBP controlled on 5 drug regimen;  CAD- s/p CABGx4 in 2002 & followed by DrBensimhon;  Hyperlipidemia on Lovastatin;  Obesity & just can't seen to lose the weight;  GERD/ Divertics/ Polyps followed by DrPerry for GI;  DJD & hx hyperuricemia;  Anxiety...  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  ~  Oct 22, 2011:  68mo ROV & [name] is c/o some diffuse arthritic aches and pains for which he uses OTC analgesics vs Vicodin;  He had labs done 3/13 (see below)& his PSA was elev at 4.98 representing a marked incr from 1.30 a yr ago> he was treated w/ Doxy100mg Bid x2wks & PSA repeated today= 4.17 so he will need referral to Urology for further eval & poss biopsy...  Navid also had an infected sebaceous cyst on his back I&D'd by [facility] & it has resolved... Finally he has had increased LBP w/ MRI from DrCaffrey showing spondylosis worst at L4-5 w/ extruded disc frag & mod central canal stenosis (see report);  He had ESI 1/13 by DrMattern w/ some improvement; he is sched for f/u w/ DrCaffrey/ DrTooke...  We reviewed his prob list, meds, XRays, & Labs>> LABS 3/13:  FLP-ok on Lova80 x HDL=33;  Chems- ok;  CBC- ok;  TSH=2.29;  PSA=4.98... LABS 5/13:  After Doxy100mg Bid x14d showed PSA=4.17 (pt referred to Urology...  ~  April 23, 2012:  68mo ROV & mult problems reviewed>>    OSA> doing well on CPAP, we couldn't find prev sleep study results, we will attempt to get download report...    HBP> on Labet200Tid, Amlod10, Lisin40, Clonid0.1Tid, Lasix20; BP=124/72 & he denies CP, palpit, ch in SOB, edema, etc...    CAD> on ASA81, Plavix75; followed by DrBensimhon; pt asked to incr exercise, get on diet, etc...    Chol> on Lova80; FLP shows TChol 126, TG 89, HDL 35, LDL 73    Obese> wt is down 6# to 257# w/ BMI in the 37-38 range; we reviewed diet, exercise, wt reduction strategies...    GI> GERD, Divertics, Polyp, Hems> on Protonix40, Levsin0.125, AnusolHC for hems...    GU> Hx RI, elev PSA, ED> renal funct ret to norm & Creat=1.1 now; he had neg prostate bx from DrBorden who continues to follow...    DJD, LBP> on Allopurinol300, Vicodin prn; followed by DrCaffrey & DrTooke; he has had epidural steroid injections for relief...    Anxiety> not currently on anxiolytic Rx... We reviewed prob list, meds, xrays and labs> see below for updates >>  CXR 11/13 showed cardiomeg, s/p median sternotomy/CABG, scar in RML, no airsp dis, multilevel spondylosis... EKG 11/13 showed NSR, rate75, rightward axis, NAD, borderline tracing... LABS 11/13:  FLP- at goals  on Mevacor80;  Chems- wnl...   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     OSA> doing well on CPAP, we couldn't find prev sleep study results, we will attempt to get download report...    HBP> on Labet200Tid, Amlod10, Lisin40, Clonid0.1Tid, Lasix20; BP=120/82 & he denies further CP, palpit, ch in SOB, edema, etc...    CAD> on ASA81, Plavix75; followed by DrBensimhon; he went to the ER 1/14 w/ CP- felt to be atyp w/ neg EKG, Enz, etc & told to f/u w/ Cards...    Chol> on Lova80; FLP shows TChol 114, TG 80, HDL 34, LDL 65; he takes FishOil & worried about reports of incr prostate ca risk from this supplement.    Obese> wt is down 5# to 251# w/ BMI in the 37 range; we reviewed diet, exercise, wt reduction strategies...    GI> GERD, Divertics, Polyp, Hems> on Protonix40, added fiber, AnusolHC for hems; he denies abd pain, dysphagia, n/v, c/d, blood seen...    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    DJD, LBP> on Allopurinol300, Vicodin & Tramadol; followed by DrCaffrey & DrTooke; he had an MRI & was told "slipped disc"; he has had epidural steroid injections but is now wanting "Laser spine" like he saw on TV...    Anxiety> not currently on anxiolytic Rx...    DERM> SEE ABOVE... We reviewed prob list, meds, xrays and labs> see below for updates >>  LABS 2/14:  PSA per DrBorden reported at 6.16 & he continues to follow LABS 3/14:  FLP- at goals on Mevacor80 x HDL=34;  Chems- wnl w/ BS=87 A1c=6.0;  CBC- wnl;  TSH=2.19          PROBLEM LIST:    ALLERGIC RHINITIS (ICD-477.9) - advised not to use Afrin etc... rather take OTC antihist (Claritin, Zyrtek, etc), Saline, FLONASE Qhs...  OBSTRUCTIVE SLEEP APNEA (ICD-327.23) - on CPAP  regularly (can't locate sleep study results), doing well by his report.  HYPERTENSION (ICD-401.9) - well controlled on a 5 drug regimen: LABETALOL 200mg Tid,  CATAPRES 0.1mg Tid,  AMLODIPINE 10mg /d,  LISINOPRIL 40mg /d,  HCTZ 25mg /d... takes meds regularly and tol well...  ~  CXR 3/11 is clear, WNL (heart upper limit of norm). ~  8/12:  He wants stronger diuretic & we will change HCT to LASIX 20mg /d, reminded regarding low sodium! ~  10/12:  BP=118/72 &  denies HA, fatigue, visual changes, CP, palipit, dizziness, syncope, dyspnea, edema, etc> needs better diet, continue same meds. ~  5/13:  BP= 120/68 & he remains asymptomatic; continue the good work... ~  CXR 11/13 showed cardiomeg, s/p median sternotomy/CABG, scar in RML, no airsp dis, multilevel spondylosis...  ~  3/14:  on Labet200Tid, Amlod10, Lisin40, Clonid0.1Tid, Lasix20; BP=120/82 & he denies further CP, palpit, ch in SOB, edema, etc.  CORONARY ARTERY DISEASE (ICD-414.00) - on above meds + ASA 81mg /d & PLAVIX 75mg /d... S/P CABG x4 9/02 by DrOwen... subseq PTCA/stent to native CIRC 6/03 (& occluded vein graft to LAD noted); last cath= 2/06 w/ PTCA/stent in LAD distal to LIMA touchdown, otherw no change... NuclearStressTest 9/07 showed neg w/ EF=62%... ~  3/10:  yearly ROV DrBensimhon doing well, no changes made... ~  3/11:  stable- StressTest was neg- no signif ST seg depression; ArtDopplers LEs showed norm ABIs. ~  He continues to f/u w/ DrBensimhon yearly> seen 6/13- noted incr edema & he rec no salt, compression hose, incr Lasix... ~  EKG 6/13 showed SBrady, rate56, otherw wnl, NAD... ~  2DEcho 7/13 showed mild LVH, norm LVF w/ EF=55-60% & no regional wall motion abn, Gr2DD, valves-ok, modLAdil, norm RV... ~  1/14:  on ASA81, Plavix75; followed by DrBensimhon; he went to the ER 1/14 w/ CP- felt to be atyp w/ neg EKG, Enz, etc & told to f/u w/ Cards.  HYPERLIPIDEMIA (ICD-272.4) - back on LOVASTATIN 80mg /d (40mg - 2tabsQhs) due to $$$...  ~   FLP 7/08 on Lovastatin showed TChol 157, TG 127, HDL 30, LDL 101... ch to LIP40 per Cards... ~  FLP 1/09 on Lip40 showed TChol 125, Tg 105, HDL 31, LDL 73... ch back to Lovastin80 due to $$$... ~  FLP 7/09 on Lova80 showed TChol 142, TG 111, HDL 31, LDL 89... rec- continue same, get wt down. ~  FLP 1/10 showed TChol 130, TG 99, HDL 30, LDL 80... rec> get wt down or stronger statin Rx! ~  FLP 3/11 showed TChol 138, TG 96, HDL 43, LDL 76... continue same. ~  FLP 8/11 showed TChol 140, TG 145, HDL 33, LDL 78 ~  FLP 8/12 on Lova80 showed TChol 146, TG 114, HDL 42, LDL 81 ~  FLP 3/13 on Lova80 showed TChol 118, TG 100, HDL 33, LDL 65 ~  FLP 3/14 on Lova80 showed TChol 114, TG 80, HDL 34, LDL 65  ~  He takes FishOil & worried about reports of incr prostate ca risk from this supplement  OBESITY (ICD-278.00) ~  7/09:  he's done a better job w/ diet + exercise and weight down 13# in 97mo to 242#. ~  1/10:  unfortunately weight back up 11# over the holidays to 253#... rec- diet/ exercise discussed. ~  10/10: weight = 260# but pt notes he & wife to start diet soon!!! ~  3/11:  weight = 260# ~  8/11:  weight = 251#.Marland Kitchen. states "my wife keeps on bringing it home" ~  8/12:  Weight = 257#... We reviewed diet & exercise prescriptions! ~  5/13:  Weight = 263# ~  11/13:  Weight = 257# ~  3/14:  Weight = 251#  GERD (ICD-530.81) - last EGD by DrSam 3/06 revealed severe antral gastitis.Marland Kitchen. now on PROTONIX 40mg /d, and PEPCID 20mg Qhs... ~  EGD 2/12 by DrPerry (sl stricture- dilated, otherw WNL)...  DIVERTICULOSIS OF COLON (ICD-562.10) & COLONIC POLYPS (ICD-211.3)  ~  Colonoscopy 7/05 by DrSam showed divertics, hems, otherw neg.Marland KitchenMarland Kitchen  f/u planned 23yrs. ~  Colonoscopy 2/12 w/ diminutive polyp, severe divertics, & int hems> path= tubular adenoma...  RENAL INSUFFICIENCY (ICD-588.9) - he knows to avoid NSAIDs and nephrotoxic drugs... 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 ~  Baseline PSA for yrs was betw 1.08 & 1.83 ~  Labs 3/13 showed PSA= 4.98 & he was treated w/ Doxy100mg  Bid x14d... ~  Labs 5/13 showed PSA= 4.17 & he was referred to Urology for further eval... ~  5/13:  Pt requested Rx for Viagra 100mg  & we reviewed precautions & contraindications... ~  6/13:  He was eval by DrBorden & had Prostate bx- pt reports that this was neg but f/u notes from DrBorden indicated this was multifocal high grade PIN...  DEGENERATIVE JOINT DISEASE (ICD-715.90) & HYPERURICEMIA (ICD-790.6) - as per eval DrBeane w/ ?gout- Rx'd w/ left knee shot, Indocin... back on ALLOPURINOL 300mg /d now... ~  labs 3/09 showed Uric= 8.7... ~  labs 7/09 showed Uric= 5.6.Marland KitchenMarland Kitchen rec- continue Allopurinol. ~  9/10: dropped timber on left foot w/ hematoma, cellulitis- Rx'd... ~  labs 9/10 showed Uric= 5.9 on Uloric40. ~  labs 3/11 showed URIC= 6.3 back on Allopurinol 300mg /d. ~  Labs 8/12 showed Uric = 6.2  BACK PAIN, LUMBAR (ICD-724.2) >> followed by DrCaffrey & DrTooke w/ MRIs etc... He has had a series of ESI injections, + VICODIN prn use... ~  He continues to be followed by DrCaffrey & DrTooke for Ortho...  ANXIETY (ICD-300.00)   Past Surgical History  Procedure Laterality Date  . Coronary artery bypass graft    . Angioplasty    . Cholecystectomy  2006    [place]  . Skin biopsy      Outpatient Encounter Prescriptions as of 09/09/2012  Medication Sig Dispense Refill  . acyclovir (ZOVIRAX) 200 MG capsule Take 1 capsule (200 mg total) by mouth 3 (three) times daily.  270 capsule  3  . allopurinol (ZYLOPRIM) 300 MG tablet Take 1 tablet (300 mg total) by mouth daily.  10 tablet  0  . Alum & Mag  Hydroxide-Simeth (MAGIC MOUTHWASH) SOLN Take 5 mLs by mouth 4 (four) times daily as needed. Swish and swallow  120 mL  0  . amLODipine (NORVASC) 10 MG tablet Take 1 tablet (10 mg total) by mouth daily.  90 tablet  3  . aspirin EC 81 MG tablet Take 81 mg by mouth every evening.      Marland Kitchen azithromycin (ZITHROMAX) 250 MG tablet Take 1 tablet (250 mg total) by mouth daily.  6 tablet  0  . cloNIDine (CATAPRES) 0.1 MG tablet Take 1 tablet (0.1 mg total) by mouth 3 (three) times daily.  270 tablet  3  . clopidogrel (PLAVIX) 75 MG tablet Take 1 tablet (75 mg total) by mouth daily.  90 tablet  3  . fexofenadine (ALLEGRA) 180 MG tablet Take 1 tablet (180 mg total) by mouth daily.  90 tablet  3  . fish oil-omega-3 fatty acids 1000 MG capsule Take 1 g by mouth 2 (two) times daily.       Marland Kitchen  fluticasone (FLONASE) 50 MCG/ACT nasal spray Place 2 sprays into the nose daily.  16 g  5  . furosemide (LASIX) 20 MG tablet Take 20-40 mg by mouth daily. 2 tablets (40 mg) on Monday and Friday, 1 tablet (20 mg) on all other days      . HYDROcodone-acetaminophen (VICODIN) 5-500 MG per tablet Take 1 tablet by mouth every 6 (six) hours as needed. For pain      . hydrocortisone (ANUSOL-HC) 2.5 % rectal cream Place rectally 2 (two) times daily. As directed  30 g  6  . labetalol (NORMODYNE) 200 MG tablet Take 1 tablet (200 mg total) by mouth 3 (three) times daily.  270 tablet  3  . lisinopril (PRINIVIL,ZESTRIL) 40 MG tablet Take 1 tablet (40 mg total) by mouth daily.  90 tablet  3  . lovastatin (MEVACOR) 40 MG tablet Take 2 tablets (80 mg total) by mouth at bedtime.  180 tablet  3  . Multiple Vitamin (MULTIVITAMIN WITH MINERALS) TABS Take 1 tablet by mouth daily.      Marland Kitchen OVER THE COUNTER MEDICATION Place 1 drop into both eyes 2 (two) times daily. Lubricant eye drops for dry eyes      . pantoprazole (PROTONIX) 40 MG tablet Take 1 tablet (40 mg total) by mouth daily.  90 tablet  3  . traMADol (ULTRAM) 50 MG tablet Take 1 tablet (50 mg  total) by mouth 3 (three) times daily as needed (for arthritis pain).  90 tablet  5  . Chlorpheniramine-DM (CORICIDIN HBP COUGH/COLD PO) Take 1 tablet by mouth every 6 (six) hours as needed. For cough       No facility-administered encounter medications on file as of 09/09/2012.    No Known Allergies   Current Medications, Allergies, Past Medical History, Past Surgical History, Family History, and Social History were reviewed in [employer] record.     Review of Systems         See HPI - all other systems neg except as noted...  The patient complains of dyspnea on exertion and difficulty walking.  The patient denies anorexia, fever, weight loss, weight gain, vision loss, decreased hearing, hoarseness, chest pain, syncope, peripheral edema, prolonged cough, headaches, hemoptysis, abdominal pain, melena, hematochezia, severe indigestion/heartburn, hematuria, incontinence, muscle weakness, suspicious skin lesions, transient blindness, depression, unusual weight change, abnormal bleeding, enlarged lymph nodes, and angioedema.   Objective:   Physical Exam    WD, Obese, 67 y/o WM in NAD... GENERAL:  Alert & oriented; pleasant & cooperative... HEENT:  [place]/AT, EOM-wnl, PERRLA, EACs-clear, TMs-wnl, NOSE-clear, THROAT-clear & wnl. NECK:  Supple w/ fairROM; no JVD; normal carotid impulses w/o bruits; no thyromegaly or nodules palpated; no lymphadenopathy. CHEST:  Median sternotomy scar, clear to P & A; without wheezes/ rales/ or rhonchi. HEART:  Regular Rhythm; without murmurs/ rubs/ or gallops heard. ABDOMEN:  Obese, soft & nontender; normal bowel sounds; no organomegaly or masses detected. EXT: without deformities, mild arthritic changes; no varicose veins/ +venous insuffic, tr edema... NEURO:  CN's intact; motor testing normal; sensory testing normal; gait normal & balance OK. DERM:  No lesions noted; no rash etc...  RADIOLOGY DATA:  Reviewed in the EPIC EMR & discussed  w/ the patient...  LABORATORY DATA:  Reviewed in the EPIC EMR & discussed w/ the patient...   Assessment & Plan:    AR>  rec to use an Antihist Qam, Saline nasal mist during the days, and Flonase Qhs...  OSA>  He reports stable  on CPAP w/o daytime hypersomnolence etc...  HBP>  Controlled on his regimen, & tolerating the Lasix well...  CAD, s/p CABG & subseq PCI/ stents>  Stable on BP meds + ASA/Plavix; followed by DrBensimhon...  HYPERLIPID>  FLP remains close to goals on diet + Lovastatin80, unfortunately he hasn't lost any weight...  OBESITY>  We reviewed diet + exercise prescription...  GI> GERD, Divertics, Polyps>  recent EGD & colon reviewed w/ the pt...  GU> elevated PSA w/ min improvement after antibiotic Rx; Urology eval w/ Bx 7/13= high grade PIN & he continues to f/u w/ DrBorden...  DJD/ GOUT/ LBP>  Prev URIC level = 6.2 on the Allopurinol daily;  He had LBP eval by DrTooke & Caffrey w/ one ESI so far...  Anxiety>  He does not want anxiolytic Rx...   Patient's Medications  New Prescriptions   No medications on file  Previous Medications   ACYCLOVIR (ZOVIRAX) 200 MG CAPSULE    Take 1 capsule (200 mg total) by mouth 3 (three) times daily.   ALLOPURINOL (ZYLOPRIM) 300 MG TABLET    Take 1 tablet (300 mg total) by mouth daily.   ALUM & MAG HYDROXIDE-SIMETH (MAGIC MOUTHWASH) SOLN    Take 5 mLs by mouth 4 (four) times daily as needed. Swish and swallow   AMLODIPINE (NORVASC) 10 MG TABLET    Take 1 tablet (10 mg total) by mouth daily.   ASPIRIN EC 81 MG TABLET    Take 81 mg by mouth every evening.   AZITHROMYCIN (ZITHROMAX) 250 MG TABLET    Take 1 tablet (250 mg total) by mouth daily.   CHLORPHENIRAMINE-DM (CORICIDIN HBP COUGH/COLD PO)    Take 1 tablet by mouth every 6 (six) hours as needed. For cough   CLONIDINE (CATAPRES) 0.1 MG TABLET    Take 1 tablet (0.1 mg total) by mouth 3 (three) times daily.   CLOPIDOGREL (PLAVIX) 75 MG TABLET    Take 1 tablet (75 mg total) by  mouth daily.   FEXOFENADINE (ALLEGRA) 180 MG TABLET    Take 1 tablet (180 mg total) by mouth daily.   FISH OIL-OMEGA-3 FATTY ACIDS 1000 MG CAPSULE    Take 1 g by mouth 2 (two) times daily.    FLUTICASONE (FLONASE) 50 MCG/ACT NASAL SPRAY    Place 2 sprays into the nose daily.   FUROSEMIDE (LASIX) 20 MG TABLET    Take 20-40 mg by mouth daily. 2 tablets (40 mg) on Monday and Friday, 1 tablet (20 mg) on all other days   HYDROCODONE-ACETAMINOPHEN (VICODIN) 5-500 MG PER TABLET    Take 1 tablet by mouth every 6 (six) hours as needed. For pain   HYDROCORTISONE (ANUSOL-HC) 2.5 % RECTAL CREAM    Place rectally 2 (two) times daily. As directed   LABETALOL (NORMODYNE) 200 MG TABLET    Take 1 tablet (200 mg total) by mouth 3 (three) times daily.   LISINOPRIL (PRINIVIL,ZESTRIL) 40 MG TABLET    Take 1 tablet (40 mg total) by mouth daily.   LOVASTATIN (MEVACOR) 40 MG TABLET    Take 2 tablets (80 mg total) by mouth at bedtime.   MULTIPLE VITAMIN (MULTIVITAMIN WITH MINERALS) TABS    Take 1 tablet by mouth daily.   OVER THE COUNTER MEDICATION    Place 1 drop into both eyes 2 (two) times daily. Lubricant eye drops for dry eyes   PANTOPRAZOLE (PROTONIX) 40 MG TABLET    Take 1 tablet (40 mg total) by mouth daily.  Modified Medications   Modified Medication Previous Medication   TRAMADOL (ULTRAM) 50 MG TABLET traMADol (ULTRAM) 50 MG tablet      Take 1 tablet (50 mg total) by mouth 3 (three) times daily as needed (for arthritis pain).    Take 1 tablet (50 mg total) by mouth 3 (three) times daily as needed (for arthritis pain).  Discontinued Medications   No medications on file

## 2012-09-09 NOTE — Patient Instructions (Addendum)
Today we updated your med list in our EPIC system...    Continue your current medications the same...  Today we did your follow up FASTING blood work...    We will contact you w/ the results when available...   We reviewed the Path report from the 09/03/12 skin biopsy by DrBenitez-Graham>>    The left shin biopsy showed a Squamous cell cancer that may not need any further treatment...       At your request we will arrange for a surgical consultation w/ Dr. Ovidio Kin for a second opinion about this...       Note> they was no report of a melanoma on the biopsy material from 09/03/12...  Keep up the good work w/ diet 7 exercise> the goal is to lose 15-20 lbs...  Let's plan a follow up visit in 37mo, sooner if needed for problems.Marland KitchenMarland Kitchen

## 2012-09-10 ENCOUNTER — Encounter (INDEPENDENT_AMBULATORY_CARE_PROVIDER_SITE_OTHER): Payer: Self-pay | Admitting: Surgery

## 2012-09-10 ENCOUNTER — Ambulatory Visit (INDEPENDENT_AMBULATORY_CARE_PROVIDER_SITE_OTHER): Payer: Medicare Other | Admitting: Surgery

## 2012-09-10 VITALS — BP 116/68 | HR 60 | Temp 97.2°F | Resp 20 | Ht 69.0 in | Wt 248.0 lb

## 2012-09-10 DIAGNOSIS — C4492 Squamous cell carcinoma of skin, unspecified: Secondary | ICD-10-CM

## 2012-09-10 NOTE — Progress Notes (Signed)
[facility]  Re:   PETER KEYWORTH DOB:   [birth year] MRN:   [id number]  ASSESSMENT AND PLAN: 1.  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  There are no mention of margins on the path report.  I discussed the indications and complications of the procedure.  The risks include bleeding, infection, and recurrence of the tumor.  He also has some lower extremity edema, which may influence wound healing.  Will let the open wound heal and plan excision in about 6 weeks.  1a. Sebaceous cyst of right shoulder (posterior)  Plan excision at the same time as the leg.  Paper on sebaceous cyst given to patient.  2.  OSA, on CPAP. 3.  Hypertension. 4.  CAD.  Sees Dr. Algis Downs. B[place]ohn. 5.  Hyperlipidemia. 6.  Morbid Obesity, BMI 36.6. 7.  On plavix.  To stop 1 week ahead of time. 8.  History of back spasms. 9.  Eczema on scalped treated 10.   Recently saw Dr. Laverle Patter for hematuria, but the work up was negative.  REFERRING PHYSICIAN: Michele Mcalpine, MD  HISTORY OF PRESENT ILLNESS: [name] is a 67 y.o. (DOB: 07-03-1945)  white male whose primary care physician is NADEL,SCOTT M, MD and comes for a squamous cell ca of the left lower leg.  He had a lesion excised by Dr. Delight Hoh, [facility], on 09/03/2012.  The left lower leg lesion showed in situ squamous cell carcinoma read by [facility], Dr. Dorris Carnes. Depcik-Smith.  No mention of margins.  He is here to discuss further surgery.  He also has a cyst on his right shoulder that he says fills up with stuff and he squeezes this stuff out about once a month.  He said that what he squeezes out smells bad.  He has never had this excised.  He is otherwise doing okay.  We talked about excising these two lesions.  He'll need to be off his Plavix for one week ahead of surgery.  He has had some actinic keratosis on his scalp treated by Dr. Delight Hoh with liquids nitrogen which are doing very well.  I last  saw him 07/01/2011 for a cyst on his back.    Past Medical History  Diagnosis Date  . CAD (coronary artery disease)     s/p CABG  . Allergic rhinitis   . OSA (obstructive sleep apnea)   . HTN (hypertension)   . Hyperlipidemia   . Obesity   . GERD (gastroesophageal reflux disease)   . Diverticulosis of colon   . Personal history of colonic polyps 07/2010    tublar adenoma  . Renal insufficiency   . Organic impotence   . DJD (degenerative joint disease)   . Lumbar back pain   . Hyperuricemia   . Anxiety   . Hemorrhoids   . Chronic gastritis   . Herpes   . Esophageal stricture   . Pinched nerve     back      Current Outpatient Prescriptions  Medication Sig Dispense Refill  . acyclovir (ZOVIRAX) 200 MG capsule Take 1 capsule (200 mg total) by mouth 3 (three) times daily.  270 capsule  3  . allopurinol (ZYLOPRIM) 300 MG tablet Take 1 tablet (300 mg total) by mouth daily.  10 tablet  0  . Alum & Mag Hydroxide-Simeth (MAGIC MOUTHWASH) SOLN Take 5 mLs by mouth 4 (four) times daily as needed. Swish and swallow  120 mL  0  .  amLODipine (NORVASC) 10 MG tablet Take 1 tablet (10 mg total) by mouth daily.  90 tablet  3  . aspirin EC 81 MG tablet Take 81 mg by mouth every evening.      Marland Kitchen azithromycin (ZITHROMAX) 250 MG tablet Take 1 tablet (250 mg total) by mouth daily.  6 tablet  0  . Chlorpheniramine-DM (CORICIDIN HBP COUGH/COLD PO) Take 1 tablet by mouth every 6 (six) hours as needed. For cough      . cloNIDine (CATAPRES) 0.1 MG tablet Take 1 tablet (0.1 mg total) by mouth 3 (three) times daily.  270 tablet  3  . clopidogrel (PLAVIX) 75 MG tablet Take 1 tablet (75 mg total) by mouth daily.  90 tablet  3  . fexofenadine (ALLEGRA) 180 MG tablet Take 1 tablet (180 mg total) by mouth daily.  90 tablet  3  . fish oil-omega-3 fatty acids 1000 MG capsule Take 1 g by mouth 2 (two) times daily.       . fluticasone (FLONASE) 50 MCG/ACT nasal spray Place 2 sprays into the nose daily.  16 g  5  .  furosemide (LASIX) 20 MG tablet Take 20-40 mg by mouth daily. 2 tablets (40 mg) on Monday and Friday, 1 tablet (20 mg) on all other days      . HYDROcodone-acetaminophen (VICODIN) 5-500 MG per tablet Take 1 tablet by mouth every 6 (six) hours as needed. For pain      . hydrocortisone (ANUSOL-HC) 2.5 % rectal cream Place rectally 2 (two) times daily. As directed  30 g  6  . labetalol (NORMODYNE) 200 MG tablet Take 1 tablet (200 mg total) by mouth 3 (three) times daily.  270 tablet  3  . lisinopril (PRINIVIL,ZESTRIL) 40 MG tablet Take 1 tablet (40 mg total) by mouth daily.  90 tablet  3  . lovastatin (MEVACOR) 40 MG tablet Take 2 tablets (80 mg total) by mouth at bedtime.  180 tablet  3  . Multiple Vitamin (MULTIVITAMIN WITH MINERALS) TABS Take 1 tablet by mouth daily.      Marland Kitchen OVER THE COUNTER MEDICATION Place 1 drop into both eyes 2 (two) times daily. Lubricant eye drops for dry eyes      . pantoprazole (PROTONIX) 40 MG tablet Take 1 tablet (40 mg total) by mouth daily.  90 tablet  3  . traMADol (ULTRAM) 50 MG tablet Take 1 tablet (50 mg total) by mouth 3 (three) times daily as needed (for arthritis pain).  90 tablet  3   No current facility-administered medications for this visit.     No Known Allergies  REVIEW OF SYSTEMS: Skin:  No history of rash.  No history of abnormal moles. Cardiac:  Hypertension. CAD.  Sees Dr. Eula Fried for cardiology. Pulmonary:  OSA, on CPAP.  Endocrine:  No diabetes. No thyroid disease. Gastrointestinal:  Lap chole by Dr. Haroldine Laws 03/21/2006.  Diverticulosis. Urology:  Recently saw Dr. Laverle Patter for hematuria, but the work up was negative.  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  PHYSICAL EXAM: BP 116/68  Pulse 60  Temp(Src) 97.2 F (36.2 C) (Temporal)  Resp 20  Ht 5\' 9"  (1.753 m)  Wt 248 lb (112.492 kg)  BMI 36.61 kg/m2  General: WN obese WM, who is alert and generally healthy appearing.  Right shoulder:  approx 2.0  cm sebaceous cyst Left leg:  approx 1.2 cm open ulcer at the site of biopsy.  He has 1+ edema of both  LE.  He has palpable pedal pulses.     Open ulcer left lower leg at biopsy site.  Ovidio Kin, MD,  [facility] [address] [place]  [address]   [place]    [address] Phone:  [phone] FAX:  [phone]

## 2012-09-15 ENCOUNTER — Telehealth (INDEPENDENT_AMBULATORY_CARE_PROVIDER_SITE_OTHER): Payer: Self-pay

## 2012-09-15 NOTE — Telephone Encounter (Signed)
P/O appt 10/30/12 @5 :15 with Dr. Ezzard Standing   Patient is aware

## 2012-10-13 ENCOUNTER — Encounter (INDEPENDENT_AMBULATORY_CARE_PROVIDER_SITE_OTHER): Payer: Self-pay

## 2012-10-22 ENCOUNTER — Other Ambulatory Visit: Payer: Self-pay | Admitting: Pulmonary Disease

## 2012-10-22 MED ORDER — ALLOPURINOL 300 MG PO TABS: 300.0000 mg | ORAL_TABLET | Freq: Every day | ORAL | Status: AC

## 2012-10-24 ENCOUNTER — Other Ambulatory Visit (INDEPENDENT_AMBULATORY_CARE_PROVIDER_SITE_OTHER): Payer: Self-pay | Admitting: Surgery

## 2012-10-24 DIAGNOSIS — L723 Sebaceous cyst: Secondary | ICD-10-CM

## 2012-10-24 DIAGNOSIS — C44721 Squamous cell carcinoma of skin of unspecified lower limb, including hip: Secondary | ICD-10-CM

## 2012-10-30 ENCOUNTER — Encounter (INDEPENDENT_AMBULATORY_CARE_PROVIDER_SITE_OTHER): Payer: Medicare Other | Admitting: Surgery

## 2012-11-03 ENCOUNTER — Encounter (HOSPITAL_COMMUNITY): Payer: Self-pay

## 2012-11-03 ENCOUNTER — Ambulatory Visit (HOSPITAL_COMMUNITY)
Admission: RE | Admit: 2012-11-03 | Discharge: 2012-11-03 | Disposition: A | Discharge status: Home / Self Care | Payer: Medicare Other | Source: Ambulatory Visit | Attending: Internal Medicine | Admitting: Internal Medicine

## 2012-11-03 VITALS — BP 100/60 | HR 53 | Wt 247.0 lb

## 2012-11-03 DIAGNOSIS — I498 Other specified cardiac arrhythmias: Secondary | ICD-10-CM | POA: Insufficient documentation

## 2012-11-03 DIAGNOSIS — I10 Essential (primary) hypertension: Secondary | ICD-10-CM | POA: Insufficient documentation

## 2012-11-03 DIAGNOSIS — R609 Edema, unspecified: Secondary | ICD-10-CM | POA: Insufficient documentation

## 2012-11-03 DIAGNOSIS — I2510 Atherosclerotic heart disease of native coronary artery without angina pectoris: Secondary | ICD-10-CM | POA: Insufficient documentation

## 2012-11-03 NOTE — Progress Notes (Signed)
HPI:  [name] is a very pleasant 67 year old male with a history of HTN, OSA, HL and CAD s/p CABG 2002 and stenting of LAD (2006) and LCX (2003) with drug-eluting stents.  Had ETT 09/2009 which was normal.   He returns for follow up today.  He is feeling good.  He is not walking as much due to back pain.  He denies chest pain.  He has increased his fluid pill to twice weekly with improvement in his ankle edema.  He denies dyspnea, orthopnea or PND.  Uses CPAP nightly.     Lipids followed by Dr. Kriste Basque.   Lab Results  Component Value Date   CHOL 114 09/09/2012   HDL 33.50* 09/09/2012   LDLCALC 65 09/09/2012   TRIG 80.0 09/09/2012   CHOLHDL 3 09/09/2012      ROS: All systems negative except as listed in HPI, PMH and Problem List.  Past Medical History  Diagnosis Date  . CAD (coronary artery disease)     s/p CABG  . Allergic rhinitis   . OSA (obstructive sleep apnea)   . HTN (hypertension)   . Hyperlipidemia   . Obesity   . GERD (gastroesophageal reflux disease)   . Diverticulosis of colon   . Personal history of colonic polyps 07/2010    tublar adenoma  . Renal insufficiency   . Organic impotence   . DJD (degenerative joint disease)   . Lumbar back pain   . Hyperuricemia   . Anxiety   . Hemorrhoids   . Chronic gastritis   . Herpes   . Esophageal stricture   . Pinched nerve     back    Current Outpatient Prescriptions  Medication Sig Dispense Refill  . acyclovir (ZOVIRAX) 200 MG capsule Take 1 capsule (200 mg total) by mouth 3 (three) times daily.  270 capsule  3  . allopurinol (ZYLOPRIM) 300 MG tablet Take 1 tablet (300 mg total) by mouth daily.  90 tablet  3  . amLODipine (NORVASC) 10 MG tablet Take 1 tablet (10 mg total) by mouth daily.  90 tablet  3  . aspirin EC 81 MG tablet Take 81 mg by mouth every evening.      Marland Kitchen azithromycin (ZITHROMAX) 250 MG tablet Take 1 tablet (250 mg total) by mouth daily.  6 tablet  0  . Chlorpheniramine-DM (CORICIDIN HBP COUGH/COLD PO) Take 1  tablet by mouth every 6 (six) hours as needed. For cough      . cloNIDine (CATAPRES) 0.1 MG tablet Take 1 tablet (0.1 mg total) by mouth 3 (three) times daily.  270 tablet  3  . clopidogrel (PLAVIX) 75 MG tablet Take 1 tablet (75 mg total) by mouth daily.  90 tablet  3  . fexofenadine (ALLEGRA) 180 MG tablet Take 1 tablet (180 mg total) by mouth daily.  90 tablet  3  . fish oil-omega-3 fatty acids 1000 MG capsule Take 1 g by mouth 2 (two) times daily.       . fluticasone (FLONASE) 50 MCG/ACT nasal spray Place 2 sprays into the nose daily.  16 g  5  . furosemide (LASIX) 20 MG tablet Take 20-40 mg by mouth daily. 2 tablets (40 mg) on Monday and Friday, 1 tablet (20 mg) on all other days      . HYDROcodone-acetaminophen (VICODIN) 5-500 MG per tablet Take 1 tablet by mouth every 6 (six) hours as needed. For pain      . hydrocortisone (ANUSOL-HC) 2.5 % rectal cream Place  rectally 2 (two) times daily. As directed  30 g  6  . labetalol (NORMODYNE) 200 MG tablet Take 1 tablet (200 mg total) by mouth 3 (three) times daily.  270 tablet  3  . lisinopril (PRINIVIL,ZESTRIL) 40 MG tablet Take 1 tablet (40 mg total) by mouth daily.  90 tablet  3  . lovastatin (MEVACOR) 40 MG tablet Take 2 tablets (80 mg total) by mouth at bedtime.  180 tablet  3  . Multiple Vitamin (MULTIVITAMIN WITH MINERALS) TABS Take 1 tablet by mouth daily.      Marland Kitchen OVER THE COUNTER MEDICATION Place 1 drop into both eyes 2 (two) times daily. Lubricant eye drops for dry eyes      . pantoprazole (PROTONIX) 40 MG tablet Take 1 tablet (40 mg total) by mouth daily.  90 tablet  3  . traMADol (ULTRAM) 50 MG tablet Take 1 tablet (50 mg total) by mouth 3 (three) times daily as needed (for arthritis pain).  90 tablet  3   No current facility-administered medications for this encounter.     PHYSICAL EXAM: Filed Vitals:   11/03/12 1353  BP: 100/60  Pulse: 53   General:  Well appearing. No resp difficulty HEENT: normal Neck: supple. JVP hard to  see looks 6 -7 . Carotids 2+ bilaterally; no bruits. No lymphadenopathy or thryomegaly appreciated. Cor: PMI normal. Regular rate & rhythm. No rubs, gallops or murmurs. Lungs: clear Abdomen: obese soft, nontender, nondistended. No bruits or masses. Good bowel sounds. Extremities: no cyanosis, clubbing, rash, 1+ ankle edema Neuro: alert & orientedx3, cranial nerves grossly intact. Moves all 4 extremities w/o difficulty. Affect pleasant.    ECG: Sinus brady 55 No ST-T wave abnormalities.     ASSESSMENT & PLAN:

## 2012-11-04 NOTE — Assessment & Plan Note (Signed)
No ischemic symptoms.  Continue ASA and lovastatin.

## 2012-11-04 NOTE — Assessment & Plan Note (Signed)
Ankle edema much improved with twice weekly lasix.  Have discussed use of sliding scale lasix and patient voices understanding.  Continue to follow.

## 2012-11-04 NOTE — Assessment & Plan Note (Signed)
Well controlled on current regimen.  Will continue amlodipine, clonidine, and lisinopril.

## 2012-11-06 ENCOUNTER — Telehealth (INDEPENDENT_AMBULATORY_CARE_PROVIDER_SITE_OTHER): Payer: Self-pay

## 2012-11-06 NOTE — Telephone Encounter (Signed)
Patient called to request his appt for suture removal be moved from 11:00 to 9:00 because he is leaving town.  Appt changed on nurse's schedule.

## 2012-11-07 ENCOUNTER — Ambulatory Visit (INDEPENDENT_AMBULATORY_CARE_PROVIDER_SITE_OTHER): Payer: Medicare Other

## 2012-11-07 VITALS — BP 132/80 | HR 68 | Temp 98.6°F | Resp 18

## 2012-11-07 DIAGNOSIS — Z4802 Encounter for removal of sutures: Secondary | ICD-10-CM

## 2012-11-07 NOTE — Progress Notes (Signed)
Patient in for nurse only ;  Left leg sutures  intact ; afebrile , no redness no drainage noted ; Cleansed area with chlora prep ;removed sutures x 5 ; Rt shoulder sutures no redness no drainage noted; cleansed area with Chlora  Prep; removed 3 sutures; Patient tolerated well ; Advised patient to call if any serous  Drainage, foul odor, temp greater than 100.3. Patient verbalized understanding

## 2012-11-14 ENCOUNTER — Telehealth: Payer: Self-pay | Admitting: Pulmonary Disease

## 2012-11-14 MED ORDER — HYDROCODONE-ACETAMINOPHEN 5-325 MG PO TABS
1.0000 | ORAL_TABLET | Freq: Three times a day (TID) | ORAL | Status: AC | PRN
Start: 2012-11-14 — End: ?

## 2012-11-14 NOTE — Telephone Encounter (Signed)
Per SN---  Ok to stop taking the tramadol and go back to the vicodin for pain. Called and spoke with pt and he is aware of SN recs and requested that the vicodin be sent in to [facility].  This has been printed out and faxed to the pharmacy.

## 2012-11-14 NOTE — Telephone Encounter (Signed)
Spoke with the pt  He states that he has been having HA and dizziness and only notices this after taking tramadol  He had been taking vicodin for pain prior to taking tramadol and wants to maybe go back to this or another pain med Please advise, thanks! Last ov 09/09/12  Next ov 03/12/13 No Known Allergies

## 2012-11-19 ENCOUNTER — Encounter (HOSPITAL_COMMUNITY): Payer: Medicare Other

## 2013-03-12 ENCOUNTER — Ambulatory Visit: Payer: Medicare Other | Admitting: Pulmonary Disease

## 2013-03-19 ENCOUNTER — Other Ambulatory Visit: Payer: Self-pay | Admitting: Pulmonary Disease

## 2013-03-19 MED ORDER — LOVASTATIN 40 MG PO TABS: 80.0000 mg | ORAL_TABLET | Freq: Every day | ORAL | Status: AC

## 2013-03-19 MED ORDER — PANTOPRAZOLE SODIUM 40 MG PO TBEC: 40.0000 mg | DELAYED_RELEASE_TABLET | Freq: Every day | ORAL | Status: AC

## 2013-03-19 MED ORDER — LISINOPRIL 40 MG PO TABS: 40.0000 mg | ORAL_TABLET | Freq: Every day | ORAL | Status: AC

## 2013-03-20 ENCOUNTER — Other Ambulatory Visit: Payer: Self-pay | Admitting: Pulmonary Disease

## 2013-03-20 MED ORDER — FUROSEMIDE 20 MG PO TABS: ORAL_TABLET | ORAL | Status: AC

## 2013-03-20 MED ORDER — CLOPIDOGREL BISULFATE 75 MG PO TABS: 75.0000 mg | ORAL_TABLET | Freq: Every day | ORAL | Status: AC

## 2013-03-20 MED ORDER — CLONIDINE HCL 0.1 MG PO TABS: 0.1000 mg | ORAL_TABLET | Freq: Three times a day (TID) | ORAL | Status: AC

## 2013-03-20 MED ORDER — ACYCLOVIR 200 MG PO CAPS: 200.0000 mg | ORAL_CAPSULE | Freq: Three times a day (TID) | ORAL | Status: AC

## 2013-03-20 MED ORDER — AMLODIPINE BESYLATE 10 MG PO TABS: 10.0000 mg | ORAL_TABLET | Freq: Every day | ORAL | Status: AC

## 2013-03-20 MED ORDER — LABETALOL HCL 200 MG PO TABS: 200.0000 mg | ORAL_TABLET | Freq: Three times a day (TID) | ORAL | Status: AC

## 2013-10-26 ENCOUNTER — Telehealth (HOSPITAL_COMMUNITY): Payer: Self-pay

## 2013-10-26 NOTE — Telephone Encounter (Signed)
Patient called to make 1 year appointment, states he has relocated to [facility] and has transferred cardiology services there.  Appreciative of our services, instructed to call us if he needs anything. Renee Pain

## 2013-10-27 ENCOUNTER — Telehealth: Payer: Self-pay | Admitting: Pulmonary Disease

## 2013-10-27 NOTE — Telephone Encounter (Signed)
Pt's last OV on 09/09/2012 with SN and is needing refill on Allopurinol 300mg  1 tab qd (paper refill). Pt aware of one 90 supply sent to pharmacy, [facility], to last pt till new set up with another PCP. Called and lmtcb on mobile phone to possibly refer pt to another PCP or if he would like to find a PCP on his own and for future refills needing to be deferred to new PCP. Home number unable to leave message.

## 2013-10-27 NOTE — Telephone Encounter (Signed)
pts PCP office called back and requested that the rx that needs to filled be faxed to them.  [name] has faxed these to that office and nothing further is needed.

## 2013-10-27 NOTE — Telephone Encounter (Signed)
After leaving voicemail for pt, received 2 more refill requests for Amlodipine 10mg  and Plavix 75mg . Called pt back to inform him of new refills and pt answered. Pt stated he is already set up with a PCP at [facility] in [place], [place] with Dr. Carlis Abbott Pritts [phone]). Pt states his new PCP refilled 3 medications recently but was unsure of which ones. Attempted to call [facility] and was transferred to triage where I left a voicemail to clarify which medications were filled and for deferral of future medications. Pt stated he just finished all of his current allopurinol and is need of new refill, pt is aware that I refilled the medication through [facility]. Pt aware that I was going to call [facility] for medication clarification.  Will forward to Leigh to follow. Paper refills given to Leigh.

## 2014-09-09 IMAGING — CR DG CHEST 2V
2 series · 2 of 2 positions shown · non-contrast
Comparison: None

CLINICAL DATA: Hypertension

CHEST - 2 VIEW

[view not recorded (1 of 2)]
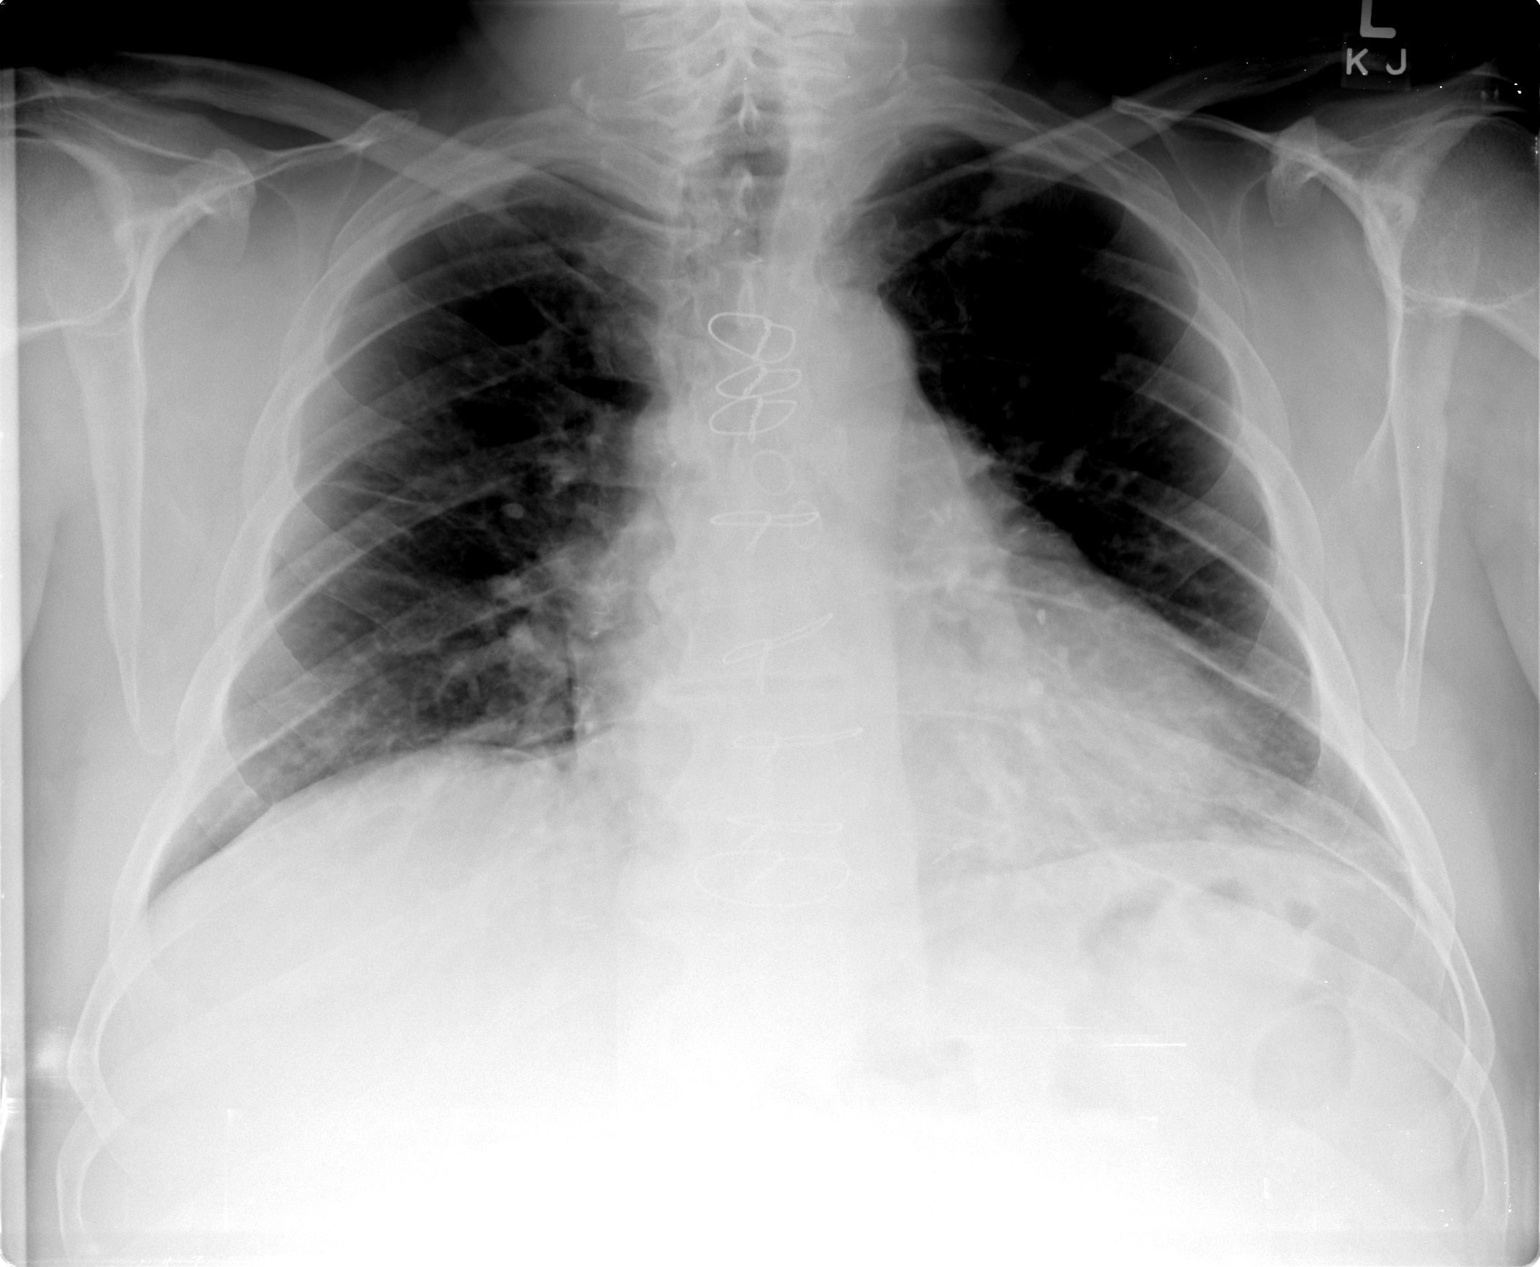

[view not recorded (2 of 2)]
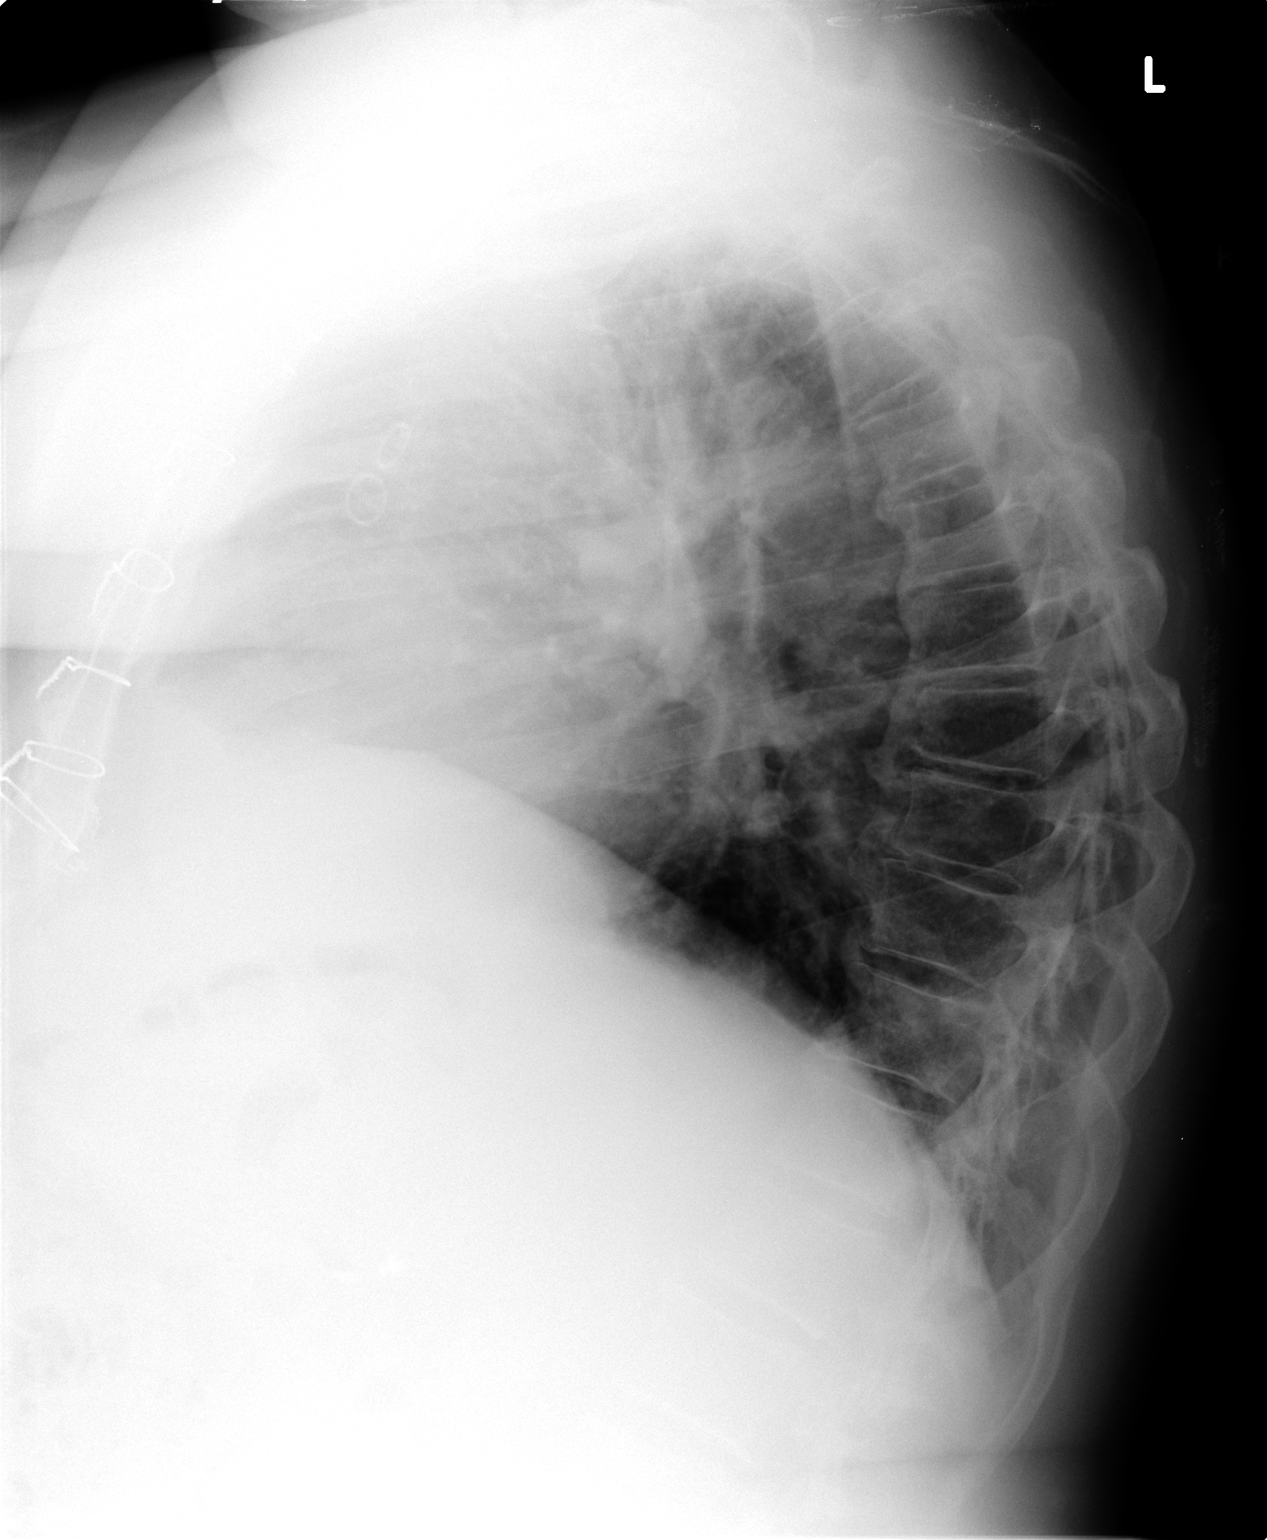

[2 of 2 positions shown; findings below may reference images not displayed]

FINDINGS: Heart size is moderately enlarged.  The patient is status post
median sternotomy and CABG procedure.  No pleural effusion or
edema.  No airspace consolidation.  Scar like density is noted
within the right midlung. There is mild multilevel spondylosis
within the thoracic spine.
IMPRESSION: 1.  Cardiac enlargement.
2.  No acute findings.

## 2015-05-10 ENCOUNTER — Encounter: Payer: Self-pay | Admitting: Internal Medicine

## 2015-08-23 ENCOUNTER — Encounter: Payer: Self-pay | Admitting: Internal Medicine
